# Patient Record
Sex: Female | Born: 1990 | Race: Black or African American | Hispanic: No | Marital: Single | Smoking: Current some day smoker
Health system: Southern US, Community
[De-identification: ages and names within clinical notes are randomized; demographics above are authoritative.]

## PROBLEM LIST (undated history)

## (undated) DIAGNOSIS — F419 Anxiety disorder, unspecified: Secondary | ICD-10-CM

## (undated) DIAGNOSIS — M419 Scoliosis, unspecified: Secondary | ICD-10-CM

## (undated) DIAGNOSIS — I10 Essential (primary) hypertension: Secondary | ICD-10-CM

## (undated) HISTORY — DX: Scoliosis, unspecified: M41.9

## (undated) HISTORY — PX: NO PAST SURGERIES: SHX2092

---

## 2015-03-17 ENCOUNTER — Emergency Department (HOSPITAL_COMMUNITY)
Admission: EM | Admit: 2015-03-17 | Discharge: 2015-03-17 | Disposition: A | Discharge status: Home / Self Care | Payer: Medicaid Other | Attending: Emergency Medicine | Admitting: Emergency Medicine

## 2015-03-17 ENCOUNTER — Encounter (HOSPITAL_COMMUNITY): Payer: Self-pay | Admitting: *Deleted

## 2015-03-17 DIAGNOSIS — Y939 Activity, unspecified: Secondary | ICD-10-CM | POA: Insufficient documentation

## 2015-03-17 DIAGNOSIS — S00551A Superficial foreign body of lip, initial encounter: Secondary | ICD-10-CM

## 2015-03-17 DIAGNOSIS — Y929 Unspecified place or not applicable: Secondary | ICD-10-CM | POA: Insufficient documentation

## 2015-03-17 DIAGNOSIS — S01521A Laceration with foreign body of lip, initial encounter: Secondary | ICD-10-CM | POA: Insufficient documentation

## 2015-03-17 DIAGNOSIS — Y998 Other external cause status: Secondary | ICD-10-CM | POA: Diagnosis not present

## 2015-03-17 DIAGNOSIS — X58XXXA Exposure to other specified factors, initial encounter: Secondary | ICD-10-CM | POA: Insufficient documentation

## 2015-03-17 NOTE — ED Notes (Signed)
Pt reports her bottom lip has grown over her lip ring x 3 months. No distress noted at triage.

## 2015-03-17 NOTE — Discharge Instructions (Signed)
PLease follow-up with your primary care doctor or plastic surgeon to have the ring removed.

## 2015-03-17 NOTE — ED Provider Notes (Signed)
CSN: [id number]     Arrival date & time 03/17/15  1247 History  This chart was scribed for Marlon Pel, PA-C, working with Nelva Nay, MD by Chestine Spore, ED Scribe. The patient was seen in room TR06C/TR06C at 1:38 PM.     Chief Complaint  Patient presents with  . Lip Laceration      The history is provided by the patient. No language interpreter was used.    HPI Comments: Priscilla Young is a 24 y.o. female who presents to the Emergency Department complaining of skin growing over lip ring.  Pt has been trying to push the lip ring out for the past couple months but she has had it for 2-3 years and just noticed that the skin has grown over, unsure for how long. Pt notes that her bottom lip has grown over her lip ring. 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 Pt has not been to the jeweler where she had her piercing at. Pt notes that she has a thin flat circle on the end of it which is the part that is stuck. Pt notes that she has to take out the lip ring because of her job. She denies fever, chills, and any other symptoms. Pt does not have a PCP but her children has one.   No past medical history on file. No past surgical history on file. No family history on file. History  Substance Use Topics  . Smoking status: Not on file  . Smokeless tobacco: Not on file  . Alcohol Use: No   OB History    No data available     Review of Systems  Constitutional: Negative for fever and chills.  Skin: Negative for color change and wound.       Lip ring stuck in lip      Allergies  Review of patient's allergies indicates no known allergies.  Home Medications   Prior to Admission medications   Not on File   BP 131/67 mmHg  Pulse 86  Temp(Src) 98.4 F (36.9 C) (Oral)  Resp 18  SpO2 100%  LMP 02/15/2015 Physical Exam  Constitutional: She is oriented to person, place, and time. She appears well-developed  and well-nourished. No distress.  HENT:  Head: Normocephalic and atraumatic.    Eyes: EOM are normal.  Neck: Neck supple. No tracheal deviation present.  Cardiovascular: Normal rate.   Pulmonary/Chest: Effort normal. No respiratory distress.  Musculoskeletal: Normal range of motion.  Neurological: She is alert and oriented to person, place, and time.  Skin: Skin is warm and dry.  Psychiatric: She has a normal mood and affect. Her behavior is normal.  Nursing note and vitals reviewed.   ED Course  Procedures (including critical care time) DIAGNOSTIC STUDIES: Oxygen Saturation is 100% on RA, nl by my interpretation.    COORDINATION OF CARE: 1:42 PM-Discussed treatment plan which includes referral to plastic surgeon with pt at bedside and pt agreed to plan.   Labs Review Labs Reviewed - No data to display  Imaging Review No results found.   EKG Interpretation None      MDM   Final diagnoses:  Foreign body in lip, initial encounter   We are unable to remove the patient's lip ring at the skin has gone completely over and would require incision for removal. Recommend she go to a piercing location or to the plastic surgeon that she has  been referred to here today at this visit. The ring shows no signs of infection, no tongue or lip swelling.  Medications - No data to display  24 y.o.Adryan Nibert's evaluation in the Emergency Department is complete. It has been determined that no acute conditions requiring further emergency intervention are present at this time. The patient/guardian have been advised of the diagnosis and plan. We have discussed signs and symptoms that warrant return to the ED, such as changes or worsening in symptoms.  Vital signs are stable at discharge. Filed Vitals:   03/17/15 1310  BP: 131/67  Pulse: 86  Temp: 98.4 F (36.9 C)  Resp: 18    Patient/guardian has voiced understanding and agreed to follow-up with the PCP or specialist.  I  personally performed the services described in this documentation, which was scribed in my presence. The recorded information has been reviewed and is accurate.    Marlon Peliffany Ahmere Hemenway, PA-C 03/17/15 1356  Nelva Nayobert Beaton, MD 03/18/15 [phone]

## 2015-03-17 NOTE — ED Notes (Signed)
Patient is alert and orientedx4.  Patient was explained discharge instructions and they understood them with no questions.   

## 2016-01-15 ENCOUNTER — Ambulatory Visit (HOSPITAL_COMMUNITY)
Admission: EM | Admit: 2016-01-15 | Discharge: 2016-01-15 | Disposition: A | Discharge status: Home / Self Care | Payer: Medicaid Other | Attending: Family Medicine | Admitting: Family Medicine

## 2016-01-15 ENCOUNTER — Encounter (HOSPITAL_COMMUNITY): Payer: Self-pay | Admitting: *Deleted

## 2016-01-15 DIAGNOSIS — A084 Viral intestinal infection, unspecified: Secondary | ICD-10-CM

## 2016-01-15 HISTORY — DX: Essential (primary) hypertension: I10

## 2016-01-15 MED ORDER — ONDANSETRON HCL 4 MG PO TABS: 4.0000 mg | ORAL_TABLET | Freq: Three times a day (TID) | ORAL | Status: DC | PRN

## 2016-01-15 NOTE — ED Provider Notes (Signed)
CSN: [id number]     Arrival date & time 01/15/16  1804 History   First MD Initiated Contact with Patient 01/15/16 1936     Chief Complaint  Patient presents with  . Emesis   (Consider location/radiation/quality/duration/timing/severity/associated sxs/prior Treatment) Patient is a 25 y.o. female presenting with vomiting. The history is provided by the patient. No language interpreter was used.  Emesis Associated symptoms: diarrhea   Associated symptoms: no abdominal pain and no chills    Patient presents for nausea/vomiting and diarrhea which began yesterday morning at 4am. Woke up with recurrent vomiting and diarrhea, which persisted all day. Was able to take water at 9pm yesterday.  Today she has done much better, with her last episode diarrhea at 0620am this morning and her last emesis at 1530pm today.  Some cramping in her abdomen from retching.    No fevers or chills. No sick contacts.  Her children (ages 59 and 3) had similar GI sxs 2 weeks ago, resolved. No sick contacts at her new job at [employer].  Ate [employer] the night before she got sick, no one else got sick.   Social Hx recently moved to [place] from [place].  Nexplanon for [facility].  No menses since Nexplanon placed.  Past Medical History  Diagnosis Date  . Hypertension    History reviewed. No pertinent past surgical history. History reviewed. No pertinent family history. Social History  Substance Use Topics  . Smoking status: None  . Smokeless tobacco: None  . Alcohol Use: No   OB History    No data available     Review of Systems  Constitutional: Negative for fever, chills and diaphoresis.  Respiratory: Negative for cough and shortness of breath.   Gastrointestinal: Positive for nausea, vomiting and diarrhea. Negative for abdominal pain.  Genitourinary: Positive for decreased urine volume. Negative for dysuria, urgency and hematuria.    Allergies  Review of patient's allergies indicates no known  allergies.  Home Medications   Prior to Admission medications   Medication Sig Start Date End Date Taking? Authorizing Provider  ondansetron (ZOFRAN) 4 MG tablet Take 1 tablet (4 mg total) by mouth every 8 (eight) hours as needed for nausea or vomiting. 01/15/16   Barbaraann Barthel, MD   Meds Ordered and Administered this Visit  Medications - No data to display  BP 126/72 mmHg  Pulse 78  Temp(Src) 98.6 F (37 C) (Oral)  Resp 18  SpO2 100% No data found.   Physical Exam  Constitutional: She appears well-developed and well-nourished. No distress.  HENT:  Head: Normocephalic and atraumatic.  Mouth/Throat: Oropharynx is clear and moist. No oropharyngeal exudate.  Moist mucus membranes.  Tongue piercing.   Neck: Neck supple.  Cardiovascular: Normal rate, regular rhythm and normal heart sounds.   Pulmonary/Chest: Effort normal and breath sounds normal. No respiratory distress. She has no wheezes. She has no rales. She exhibits no tenderness.  Abdominal: Soft. Bowel sounds are normal. She exhibits no distension and no mass. There is no tenderness. There is no rebound and no guarding.  No CVA tenderness.  No tenderness or guarding in abdomen.  No suprapubic tenderness.   Lymphadenopathy:    She has no cervical adenopathy.  Skin: She is not diaphoretic.    ED Course  Procedures (including critical care time)  Labs Review Labs Reviewed - No data to display  Imaging Review No results found.   Visual Acuity Review  Right Eye Distance:   Left Eye Distance:  Bilateral Distance:    Right Eye Near:   Left Eye Near:    Bilateral Near:         MDM   1. Viral gastroenteritis    Suspect viral gastroenteritis. Discussed hygiene measures to prevent spread. Oral rehydration; diet as tolerated. Small Rx Zofran in case return of nausea, [facility].  Appears well now, no indications for labs or need for IVF by exam and vitals.     Barbaraann BarthelJames O  Magdaleno Lortie, MD 01/15/16 [id number]

## 2016-01-15 NOTE — ED Notes (Signed)
Pt  Reports  Symptoms  Of  Nausea   Vomiting  /  Diarrhea   With  Onset   Yesterday  At  About  4  Pm         pt  Thinks  It may be  A  Stomach  Virus    She is  Not  Actively  Vomiting  At  This  Time    She  Is  Sitting  Upright on the  Exam table  In no  Severe  Distress

## 2016-01-15 NOTE — Discharge Instructions (Signed)
It is a pleasure to see you today.  I believe your symptoms are caused by a viral gastroenteritis.   As we discussed, small sips of liquids (sports drinks, ginger ale) frequently throughout the day.  Avoid greasy or fried foods; try bland things (mashed potatoes, toast, noodles, rice) and advance the diet as you tolerate.   I am giving a prescription for an anti-nausea medicine ZOFRAN.  Only take this medicine if you have a return of the nausea and vomiting. If it persists, please return to the urgent care center or the emergency department for re-evaluation.

## 2016-08-17 ENCOUNTER — Encounter (HOSPITAL_COMMUNITY): Payer: Self-pay

## 2016-08-17 ENCOUNTER — Emergency Department (HOSPITAL_COMMUNITY)
Admission: EM | Admit: 2016-08-17 | Discharge: 2016-08-17 | Disposition: A | Discharge status: Home / Self Care | Payer: Medicaid Other | Attending: Emergency Medicine | Admitting: Emergency Medicine

## 2016-08-17 ENCOUNTER — Emergency Department (HOSPITAL_COMMUNITY): Payer: Medicaid Other

## 2016-08-17 DIAGNOSIS — R05 Cough: Secondary | ICD-10-CM | POA: Diagnosis present

## 2016-08-17 DIAGNOSIS — J069 Acute upper respiratory infection, unspecified: Secondary | ICD-10-CM | POA: Diagnosis not present

## 2016-08-17 DIAGNOSIS — B9789 Other viral agents as the cause of diseases classified elsewhere: Secondary | ICD-10-CM

## 2016-08-17 DIAGNOSIS — I10 Essential (primary) hypertension: Secondary | ICD-10-CM | POA: Diagnosis not present

## 2016-08-17 MED ORDER — PROMETHAZINE-PHENYLEPH-CODEINE 6.25-5-10 MG/5ML PO SYRP: 5.0000 mL | ORAL_SOLUTION | Freq: Four times a day (QID) | ORAL | 0 refills | Status: DC | PRN

## 2016-08-17 MED ORDER — NAPROXEN 250 MG PO TABS
375.0000 mg | ORAL_TABLET | Freq: Once | ORAL | Status: AC
  Administered 2016-08-17: 375 mg via ORAL
  Filled 2016-08-17: qty 2

## 2016-08-17 NOTE — ED Provider Notes (Signed)
[facility]-EMERGENCY DEPT Provider Note   CSN: [id number] Arrival date & time: 08/17/16  1736  By signing my name below, I, Teofilo PodMatthew P. Jamison, attest that this documentation has been prepared under the direction and in the presence of Felicie Mornavid Khylee Algeo, NP. Electronically Signed: Teofilo PodMatthew P. Jamison, ED Scribe. 08/17/2016. 6:19 PM.     History   Chief Complaint Chief Complaint  Patient presents with  . cough, congestion, headache   The history is provided by the patient. No language interpreter was used.   HPI Comments:  Priscilla Young is a 25 y.o. female who presents to the Emergency Department complaining of multiple URI symptoms x 1 week. Pt complains of associated sore throat, rhinorrhea, cough, congestion, headache, low-grade fever, intermittent chills, lightheadedness. Pt reports exposure to mildew in her apartment. No alleviating factors noted. Pt denies nausea, vomiting.    Past Medical History:  Diagnosis Date  . Hypertension     There are no active problems to display for this patient.   History reviewed. No pertinent surgical history.  OB History    No data available       Home Medications    Prior to Admission medications   Medication Sig Start Date End Date Taking? Authorizing Provider  ondansetron (ZOFRAN) 4 MG tablet Take 1 tablet (4 mg total) by mouth every 8 (eight) hours as needed for nausea or vomiting. 01/15/16   Barbaraann BarthelJames O Breen, MD    Family History No family history on file.  Social History Social History  Substance Use Topics  . Smoking status: Not on file  . Smokeless tobacco: Not on file  . Alcohol use No     Allergies   Patient has no known allergies.   Review of Systems Review of Systems  Constitutional: Positive for chills and fever.  HENT: Positive for congestion, rhinorrhea and sore throat.   Respiratory: Positive for cough.   Gastrointestinal: Negative for nausea and vomiting.  Neurological: Positive for light-headedness and  headaches.  All other systems reviewed and are negative.    Physical Exam Updated Vital Signs BP 118/79 (BP Location: Right Arm)   Pulse 76   Temp 98.1 F (36.7 C) (Oral)   Resp 18   SpO2 100%   Physical Exam  Constitutional: She appears well-developed and well-nourished. No distress.  HENT:  Head: Normocephalic and atraumatic.  Mouth/Throat: Posterior oropharyngeal erythema present.  Mild swelling noted to nasal turbinates.   Eyes: Conjunctivae are normal.  Cardiovascular: Normal rate.   Pulmonary/Chest: Effort normal.  Expiratory rhonchi on right chest.   Abdominal: She exhibits no distension.  Neurological: She is alert.  Skin: Skin is warm and dry.  Psychiatric: She has a normal mood and affect.  Nursing note and vitals reviewed.    ED Treatments / Results  DIAGNOSTIC STUDIES:  Oxygen Saturation is 100% on RA, normal by my interpretation.    COORDINATION OF CARE:  6:19 PM Discussed treatment plan with pt at bedside and pt agreed to plan.   Labs (all labs ordered are listed, but only abnormal results are displayed) Labs Reviewed - No data to display  EKG  EKG Interpretation None       Radiology Dg Chest 2 View  Result Date: 08/17/2016 CLINICAL DATA:  Mid and upper chest pain with productive cough for 1 week. EXAM: CHEST  2 VIEW COMPARISON:  None. FINDINGS: Lungs are clear. Heart size is normal. No pneumothorax or pleural effusion. No bony abnormality. IMPRESSION: Negative chest. Electronically Signed   By:  Drusilla Kannerhomas  Dalessio M.D.   On: 08/17/2016 19:11    Procedures Procedures (including critical care time)  Medications Ordered in ED Medications - No data to display   Initial Impression / Assessment and Plan / ED Course  I have reviewed the triage vital signs and the nursing notes.  Pertinent labs & imaging results that were available during my care of the patient were reviewed by me and considered in my medical decision making (see chart for  details).  Clinical Course   Pt symptoms consistent with URI. CXR negative for acute infiltrate. Pt will be discharged with symptomatic treatment.  Discussed return precautions.  Pt is hemodynamically stable & in NAD prior to discharge.   Final Clinical Impressions(s) / ED Diagnoses   Final diagnoses:  Viral URI with cough    New Prescriptions New Prescriptions   PROMETHAZINE-PHENYLEPH-CODEINE 6.25-5-10 MG/5ML SYRP    Take 5 mLs by mouth every 6 (six) hours as needed.   I personally performed the services described in this documentation, which was scribed in my presence. The recorded information has been reviewed and is accurate.    Felicie Mornavid Ryosuke Ericksen, NP 08/17/16 2034    Lyndal Pulleyaniel Knott, MD 08/18/16 [phone]

## 2016-08-17 NOTE — ED Triage Notes (Signed)
Patient here with frontal headache, cough and congestion x 1 week. Reports low grade fever at work. Thinks related to mold in her apartment, NAD

## 2016-08-17 NOTE — ED Notes (Signed)
Patient returned from xray.

## 2019-09-30 ENCOUNTER — Other Ambulatory Visit: Payer: Self-pay

## 2019-09-30 ENCOUNTER — Emergency Department (HOSPITAL_COMMUNITY)
Admission: EM | Admit: 2019-09-30 | Discharge: 2019-10-01 | Disposition: A | Discharge status: Home / Self Care | Payer: Medicaid Other | Attending: Emergency Medicine | Admitting: Emergency Medicine

## 2019-09-30 DIAGNOSIS — T07XXXA Unspecified multiple injuries, initial encounter: Secondary | ICD-10-CM | POA: Diagnosis present

## 2019-09-30 DIAGNOSIS — R0789 Other chest pain: Secondary | ICD-10-CM | POA: Diagnosis not present

## 2019-09-30 DIAGNOSIS — S5012XA Contusion of left forearm, initial encounter: Secondary | ICD-10-CM | POA: Diagnosis not present

## 2019-09-30 DIAGNOSIS — Y939 Activity, unspecified: Secondary | ICD-10-CM | POA: Diagnosis not present

## 2019-09-30 DIAGNOSIS — Y929 Unspecified place or not applicable: Secondary | ICD-10-CM | POA: Diagnosis not present

## 2019-09-30 DIAGNOSIS — I10 Essential (primary) hypertension: Secondary | ICD-10-CM | POA: Insufficient documentation

## 2019-09-30 DIAGNOSIS — S7002XA Contusion of left hip, initial encounter: Secondary | ICD-10-CM | POA: Insufficient documentation

## 2019-09-30 DIAGNOSIS — Y999 Unspecified external cause status: Secondary | ICD-10-CM | POA: Insufficient documentation
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
## 2019-10-01 ENCOUNTER — Emergency Department (HOSPITAL_COMMUNITY): Payer: Medicaid Other

## 2019-10-01 ENCOUNTER — Other Ambulatory Visit: Payer: Self-pay

## 2019-10-01 LAB — POC URINE PREG, ED: Preg Test, Ur: NEGATIVE

## 2019-10-01 MED ORDER — IBUPROFEN 400 MG PO TABS
400.0000 mg | ORAL_TABLET | Freq: Once | ORAL | Status: AC
  Administered 2019-10-01: 400 mg via ORAL
  Filled 2019-10-01: qty 1
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
## 2019-10-01 NOTE — Discharge Instructions (Addendum)
Apply ice for 30 minutes at a time, 3-4 times a day.  Take acetaminophen or ibuprofen or naproxen as needed for pain.

## 2019-10-01 NOTE — ED Notes (Signed)
Pt to xr 

## 2019-10-01 NOTE — ED Notes (Signed)
Pt verbalized under standing of d/c instruction and s/s requiring return to ed. Pt had no further questions at this time.

## 2019-10-02 ENCOUNTER — Ambulatory Visit (HOSPITAL_COMMUNITY)
Admission: EM | Admit: 2019-10-02 | Discharge: 2019-10-02 | Disposition: A | Discharge status: Home / Self Care | Payer: Medicaid Other | Attending: Family Medicine | Admitting: Family Medicine

## 2019-10-02 ENCOUNTER — Other Ambulatory Visit: Payer: Self-pay

## 2019-10-02 ENCOUNTER — Encounter (HOSPITAL_COMMUNITY): Payer: Self-pay | Admitting: Emergency Medicine

## 2019-10-02 ENCOUNTER — Emergency Department (HOSPITAL_COMMUNITY)
Admission: EM | Admit: 2019-10-02 | Discharge: 2019-10-03 | Disposition: A | Discharge status: Home / Self Care | Payer: Medicaid Other | Attending: Emergency Medicine | Admitting: Emergency Medicine

## 2019-10-02 ENCOUNTER — Encounter (HOSPITAL_COMMUNITY): Payer: Self-pay

## 2019-10-02 DIAGNOSIS — Y92410 Unspecified street and highway as the place of occurrence of the external cause: Secondary | ICD-10-CM | POA: Insufficient documentation

## 2019-10-02 DIAGNOSIS — R0789 Other chest pain: Secondary | ICD-10-CM

## 2019-10-02 DIAGNOSIS — Z5321 Procedure and treatment not carried out due to patient leaving prior to being seen by health care provider: Secondary | ICD-10-CM | POA: Diagnosis not present

## 2019-10-02 DIAGNOSIS — M545 Low back pain: Secondary | ICD-10-CM | POA: Insufficient documentation

## 2019-10-02 DIAGNOSIS — Y9389 Activity, other specified: Secondary | ICD-10-CM | POA: Insufficient documentation

## 2019-10-02 DIAGNOSIS — R110 Nausea: Secondary | ICD-10-CM | POA: Diagnosis not present

## 2019-10-02 DIAGNOSIS — M791 Myalgia, unspecified site: Secondary | ICD-10-CM | POA: Diagnosis not present

## 2019-10-02 DIAGNOSIS — R519 Headache, unspecified: Secondary | ICD-10-CM

## 2019-10-02 DIAGNOSIS — M546 Pain in thoracic spine: Secondary | ICD-10-CM | POA: Diagnosis not present

## 2019-10-02 DIAGNOSIS — Y999 Unspecified external cause status: Secondary | ICD-10-CM | POA: Diagnosis not present

## 2019-10-02 MED ORDER — NAPROXEN 500 MG PO TABS: 500.0000 mg | ORAL_TABLET | Freq: Two times a day (BID) | ORAL | 0 refills | Status: DC

## 2019-10-02 MED ORDER — ONDANSETRON 4 MG PO TBDP: 4.0000 mg | ORAL_TABLET | Freq: Three times a day (TID) | ORAL | 0 refills | Status: DC | PRN

## 2019-10-02 MED ORDER — CYCLOBENZAPRINE HCL 5 MG PO TABS: 5.0000 mg | ORAL_TABLET | Freq: Two times a day (BID) | ORAL | 0 refills | Status: DC | PRN
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
## 2019-10-02 NOTE — Discharge Instructions (Signed)
Please go to emergency room for further evaluation of worsening headache
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
## 2019-10-03 NOTE — ED Notes (Signed)
No answer x3 for vitals recheck and not visible in lobby

## 2020-03-31 ENCOUNTER — Encounter (HOSPITAL_COMMUNITY): Payer: Self-pay | Admitting: Emergency Medicine

## 2020-03-31 ENCOUNTER — Other Ambulatory Visit: Payer: Self-pay

## 2020-03-31 ENCOUNTER — Emergency Department (HOSPITAL_COMMUNITY)
Admission: EM | Admit: 2020-03-31 | Discharge: 2020-03-31 | Disposition: A | Discharge status: Home / Self Care | Payer: Medicaid Other | Attending: Emergency Medicine | Admitting: Emergency Medicine

## 2020-03-31 DIAGNOSIS — I10 Essential (primary) hypertension: Secondary | ICD-10-CM | POA: Insufficient documentation

## 2020-03-31 DIAGNOSIS — N390 Urinary tract infection, site not specified: Secondary | ICD-10-CM | POA: Diagnosis not present

## 2020-03-31 DIAGNOSIS — R300 Dysuria: Secondary | ICD-10-CM | POA: Insufficient documentation

## 2020-03-31 DIAGNOSIS — R1030 Lower abdominal pain, unspecified: Secondary | ICD-10-CM | POA: Diagnosis present

## 2020-03-31 LAB — URINALYSIS, ROUTINE W REFLEX MICROSCOPIC
Bacteria, UA: NONE SEEN
Bilirubin Urine: NEGATIVE
Glucose, UA: NEGATIVE mg/dL
Ketones, ur: NEGATIVE mg/dL
Nitrite: NEGATIVE
Protein, ur: 100 mg/dL — AB
Specific Gravity, Urine: 1.012 (ref 1.005–1.030)
WBC, UA: >50 WBC/hpf — ABNORMAL HIGH (ref 0–5)
pH: 5 (ref 5.0–8.0)

## 2020-03-31 LAB — COMPREHENSIVE METABOLIC PANEL
ALT: 18 U/L (ref 0–44)
AST: 15 U/L (ref 15–41)
Albumin: 3.9 g/dL (ref 3.5–5.0)
Alkaline Phosphatase: 46 U/L (ref 38–126)
Anion gap: 8 (ref 5–15)
BUN: 9 mg/dL (ref 6–20)
CO2: 23 mmol/L (ref 22–32)
Calcium: 9.5 mg/dL (ref 8.9–10.3)
Chloride: 107 mmol/L (ref 98–111)
Creatinine, Ser: 0.87 mg/dL (ref 0.44–1.00)
GFR calc Af Amer: >60 mL/min (ref >60)
GFR calc non Af Amer: >60 mL/min (ref >60)
Glucose, Bld: 116 mg/dL — ABNORMAL HIGH (ref 70–99)
Potassium: 3.9 mmol/L (ref 3.5–5.1)
Sodium: 138 mmol/L (ref 135–145)
Total Bilirubin: 0.3 mg/dL (ref 0.3–1.2)
Total Protein: 7.5 g/dL (ref 6.5–8.1)

## 2020-03-31 LAB — CBC
HCT: 39.6 % (ref 36.0–46.0)
Hemoglobin: 12.1 g/dL (ref 12.0–15.0)
MCH: 25.2 pg — ABNORMAL LOW (ref 26.0–34.0)
MCHC: 30.6 g/dL (ref 30.0–36.0)
MCV: 82.5 fL (ref 80.0–100.0)
Platelets: 211 10*3/uL (ref 150–400)
RBC: 4.8 MIL/uL (ref 3.87–5.11)
RDW: 13.6 % (ref 11.5–15.5)
WBC: 12.1 10*3/uL — ABNORMAL HIGH (ref 4.0–10.5)
nRBC: 0 % (ref 0.0–0.2)

## 2020-03-31 LAB — POC URINE PREG, ED: Preg Test, Ur: NEGATIVE

## 2020-03-31 MED ORDER — CEPHALEXIN 500 MG PO CAPS: 500.0000 mg | ORAL_CAPSULE | Freq: Two times a day (BID) | ORAL | 0 refills | Status: DC

## 2020-03-31 MED ORDER — PHENAZOPYRIDINE HCL 200 MG PO TABS: 200.0000 mg | ORAL_TABLET | Freq: Three times a day (TID) | ORAL | 0 refills | Status: DC

## 2020-03-31 MED ORDER — KETOROLAC TROMETHAMINE 60 MG/2ML IM SOLN
30.0000 mg | Freq: Once | INTRAMUSCULAR | Status: AC
  Administered 2020-03-31: 30 mg via INTRAMUSCULAR
  Filled 2020-03-31: qty 2
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
## 2020-03-31 NOTE — ED Triage Notes (Signed)
Pt c/o bladder pain for the past few days and burning sensation with urination.

## 2020-03-31 NOTE — Discharge Instructions (Signed)
Take Keflex twice a day for 5 days for a bladder infection Take Pyridium as directed for bladder pain and difficulty urinating. This medicine will make your urine turn orange and can stain your underwear so you may want to wear a liner Return if you are not improving in the next 2-3 days

## 2020-04-03 LAB — URINE CULTURE: Culture: >=100000 — AB

## 2020-04-04 ENCOUNTER — Telehealth: Payer: Self-pay

## 2020-04-04 NOTE — Telephone Encounter (Signed)
Post ED Visit - Positive Culture Follow-up  Culture report reviewed by antimicrobial stewardship pharmacist: [facility] []  , Pharm.D. []  Enzo Bi, Pharm.D., BCPS AQ-ID []  , Pharm.D., BCPS []  Celedonio Miyamoto, Pharm.D., BCPS []  [place], Garvin Fila.D., BCPS, AAHIVP []  , Pharm.D., BCPS, AAHIVP []  Georgina Pillion, PharmD, BCPS []  , PharmD, BCPS []  [place], PharmD, BCPS []  [address], PharmD []  , PharmD, BCPS []  Estella Husk, PharmD [facility] []  , PharmD []  Phillips Climes, PharmD []  , PharmD []  Agapito Games, Rph []  ) Verlan Friends, PharmD []  , PharmD []  Mervyn Gay, PharmD []  , PharmD []  Vinnie Level, PharmD []  Roselie Skinner, PharmD []  Retta Diones, PharmD []  , PharmD []  Len Childs, PharmD   Positive urine culture Treated with Cephalexin, organism sensitive to the same and no further patient follow-up is required at this time.  04/04/2020, 8:29 AM

## 2020-06-20 ENCOUNTER — Encounter (HOSPITAL_COMMUNITY): Payer: Self-pay | Admitting: Obstetrics & Gynecology

## 2020-06-20 ENCOUNTER — Inpatient Hospital Stay (HOSPITAL_COMMUNITY)
Admission: AD | Admit: 2020-06-20 | Discharge: 2020-06-20 | Disposition: A | Discharge status: Home / Self Care | Payer: Medicaid Other | Attending: Obstetrics & Gynecology | Admitting: Obstetrics & Gynecology

## 2020-06-20 ENCOUNTER — Other Ambulatory Visit: Payer: Self-pay

## 2020-06-20 DIAGNOSIS — D649 Anemia, unspecified: Secondary | ICD-10-CM | POA: Insufficient documentation

## 2020-06-20 DIAGNOSIS — O161 Unspecified maternal hypertension, first trimester: Secondary | ICD-10-CM | POA: Diagnosis not present

## 2020-06-20 DIAGNOSIS — O0931 Supervision of pregnancy with insufficient antenatal care, first trimester: Secondary | ICD-10-CM | POA: Insufficient documentation

## 2020-06-20 DIAGNOSIS — W19XXXA Unspecified fall, initial encounter: Secondary | ICD-10-CM

## 2020-06-20 DIAGNOSIS — O99011 Anemia complicating pregnancy, first trimester: Secondary | ICD-10-CM | POA: Diagnosis not present

## 2020-06-20 DIAGNOSIS — Z3A01 Less than 8 weeks gestation of pregnancy: Secondary | ICD-10-CM | POA: Insufficient documentation

## 2020-06-20 DIAGNOSIS — O2691 Pregnancy related conditions, unspecified, first trimester: Secondary | ICD-10-CM | POA: Insufficient documentation

## 2020-06-20 DIAGNOSIS — Z8679 Personal history of other diseases of the circulatory system: Secondary | ICD-10-CM

## 2020-06-20 DIAGNOSIS — O99891 Other specified diseases and conditions complicating pregnancy: Secondary | ICD-10-CM | POA: Diagnosis not present

## 2020-06-20 DIAGNOSIS — Z8249 Family history of ischemic heart disease and other diseases of the circulatory system: Secondary | ICD-10-CM | POA: Insufficient documentation

## 2020-06-20 DIAGNOSIS — R55 Syncope and collapse: Secondary | ICD-10-CM | POA: Diagnosis not present

## 2020-06-20 DIAGNOSIS — Z3201 Encounter for pregnancy test, result positive: Secondary | ICD-10-CM

## 2020-06-20 HISTORY — DX: Anxiety disorder, unspecified: F41.9

## 2020-06-20 LAB — URINALYSIS, ROUTINE W REFLEX MICROSCOPIC
Bilirubin Urine: NEGATIVE
Glucose, UA: NEGATIVE mg/dL
Hgb urine dipstick: NEGATIVE
Ketones, ur: NEGATIVE mg/dL
Leukocytes,Ua: NEGATIVE
Nitrite: NEGATIVE
Protein, ur: NEGATIVE mg/dL
Specific Gravity, Urine: 1.023 (ref 1.005–1.030)
pH: 6 (ref 5.0–8.0)

## 2020-06-20 LAB — COMPREHENSIVE METABOLIC PANEL
ALT: 28 U/L (ref 0–44)
AST: 19 U/L (ref 15–41)
Albumin: 3.4 g/dL — ABNORMAL LOW (ref 3.5–5.0)
Alkaline Phosphatase: 31 U/L — ABNORMAL LOW (ref 38–126)
Anion gap: 7 (ref 5–15)
BUN: 8 mg/dL (ref 6–20)
CO2: 24 mmol/L (ref 22–32)
Calcium: 8.9 mg/dL (ref 8.9–10.3)
Chloride: 106 mmol/L (ref 98–111)
Creatinine, Ser: 0.69 mg/dL (ref 0.44–1.00)
GFR, Estimated: >60 mL/min (ref >60)
Glucose, Bld: 97 mg/dL (ref 70–99)
Potassium: 4.4 mmol/L (ref 3.5–5.1)
Sodium: 137 mmol/L (ref 135–145)
Total Bilirubin: 0.2 mg/dL — ABNORMAL LOW (ref 0.3–1.2)
Total Protein: 6.5 g/dL (ref 6.5–8.1)

## 2020-06-20 LAB — CBC
HCT: 33.5 % — ABNORMAL LOW (ref 36.0–46.0)
Hemoglobin: 10.7 g/dL — ABNORMAL LOW (ref 12.0–15.0)
MCH: 25.9 pg — ABNORMAL LOW (ref 26.0–34.0)
MCHC: 31.9 g/dL (ref 30.0–36.0)
MCV: 81.1 fL (ref 80.0–100.0)
Platelets: 235 10*3/uL (ref 150–400)
RBC: 4.13 MIL/uL (ref 3.87–5.11)
RDW: 14.8 % (ref 11.5–15.5)
WBC: 9.1 10*3/uL (ref 4.0–10.5)
nRBC: 0 % (ref 0.0–0.2)

## 2020-06-20 LAB — POCT PREGNANCY, URINE: Preg Test, Ur: POSITIVE — AB

## 2020-06-20 MED ORDER — DOXYLAMINE SUCCINATE (SLEEP) 25 MG PO TABS: 25.0000 mg | ORAL_TABLET | Freq: Three times a day (TID) | ORAL | 0 refills | Status: DC | PRN

## 2020-06-20 MED ORDER — PYRIDOXINE HCL 25 MG PO TABS: 25.0000 mg | ORAL_TABLET | Freq: Three times a day (TID) | ORAL | 0 refills | Status: DC

## 2020-06-20 NOTE — Discharge Instructions (Signed)
Safe Medications in Pregnancy   Acne: Benzoyl Peroxide Salicylic Acid  Backache/Headache: Tylenol: 2 regular strength every 4 hours OR              2 Extra strength every 6 hours  Colds/Coughs/Allergies: Benadryl (alcohol free) 25 mg every 6 hours as needed Breath right strips Claritin Cepacol throat lozenges Chloraseptic throat spray Cold-Eeze- up to three times per day Cough drops, alcohol free Flonase (by prescription only) Guaifenesin Mucinex Robitussin DM (plain only, alcohol free) Saline nasal spray/drops Sudafed (pseudoephedrine) & Actifed ** use only after [redacted] weeks gestation and if you do not have high blood pressure Tylenol Vicks Vaporub Zinc lozenges Zyrtec   Constipation: Colace Ducolax suppositories Fleet enema Glycerin suppositories Metamucil Milk of magnesia Miralax Senokot Smooth move tea  Diarrhea: Kaopectate Imodium A-D  *NO pepto Bismol  Hemorrhoids: Anusol Anusol HC Preparation H Tucks  Indigestion: Tums Maalox Mylanta Zantac  Pepcid  Insomnia: Benadryl (alcohol free) 25mg  every 6 hours as needed Tylenol PM Unisom, no Gelcaps  Leg Cramps: Tums MagGel  Nausea/Vomiting:  Bonine Dramamine Emetrol Ginger extract Sea bands Meclizine  Nausea medication to take during pregnancy:  Unisom (doxylamine succinate 25 mg tablets) Take one tablet daily at bedtime. If symptoms are not adequately controlled, the dose can be increased to a maximum recommended dose of two tablets daily (1/2 tablet in the morning, 1/2 tablet mid-afternoon and one at bedtime). Vitamin B6 100mg  tablets. Take one tablet twice a day (up to 200 mg per day).  Skin Rashes: Aveeno products Benadryl cream or 25mg  every 6 hours as needed Calamine Lotion 1% cortisone cream  Yeast infection: Gyne-lotrimin 7 Monistat 7   **If taking multiple medications, please check labels to avoid duplicating the same active ingredients **take medication as directed on  the label ** Do not exceed 4000 mg of tylenol in 24 hours **Do not take medications that contain aspirin or ibuprofen     Eating Plan for Pregnant Women While you are pregnant, your body requires additional nutrition to help support your growing baby. You also have a higher need for some vitamins and minerals, such as folic acid, calcium, iron, and vitamin D. Eating a healthy, well-balanced diet is very important for your health and your baby's health. Your need for extra calories varies for the three 24-month segments of your pregnancy (trimesters). For most women, it is recommended to consume:  150 extra calories a day during the first trimester.  300 extra calories a day during the second trimester.  300 extra calories a day during the third trimester. What are tips for following this plan?   Do not try to lose weight or go on a diet during pregnancy.  Limit your overall intake of foods that have "empty calories." These are foods that have little nutritional value, such as sweets, desserts, candies, and sugar-sweetened beverages.  Eat a variety of foods (especially fruits and vegetables) to get a full range of vitamins and minerals.  Take a prenatal vitamin to help meet your additional vitamin and mineral needs during pregnancy, specifically for folic acid, iron, calcium, and vitamin D.  Remember to stay active. Ask your health care provider what types of exercise and activities are safe for you.  Practice good food safety and cleanliness. Wash your hands before you eat and after you prepare raw meat. Wash all fruits and vegetables well before peeling or eating. Taking these actions can help to prevent food-borne illnesses that can be very dangerous to your baby, such  as listeriosis. Ask your health care provider for more information about listeriosis. What does 150 extra calories look like? Healthy options that provide 150 extra calories each day could be any of the following:  6-8  oz (170-230 g) of plain low-fat yogurt with  cup of berries.  1 apple with 2 teaspoons (11 g) of peanut butter.  Cut-up vegetables with  cup (60 g) of hummus.  8 oz (230 mL) or 1 cup of low-fat chocolate milk.  1 stick of string cheese with 1 medium orange.  1 peanut butter and jelly sandwich that is made with one slice of whole-wheat bread and 1 tsp (5 g) of peanut butter. For 300 extra calories, you could eat two of those healthy options each day. What is a healthy amount of weight to gain? The right amount of weight gain for you is based on your BMI before you became pregnant. If your BMI:  Was less than 18 (underweight), you should gain 28-40 lb (13-18 kg).  Was 18-24.9 (normal), you should gain 25-35 lb (11-16 kg).  Was 25-29.9 (overweight), you should gain 15-25 lb (7-11 kg).  Was 30 or greater (obese), you should gain 11-20 lb (5-9 kg). What if I am having twins or multiples? Generally, if you are carrying twins or multiples:  You may need to eat 300-600 extra calories a day.  The recommended range for total weight gain is 25-54 lb (11-25 kg), depending on your BMI before pregnancy.  Talk with your health care provider to find out about nutritional needs, weight gain, and exercise that is right for you. What foods can I eat?  Fruits All fruits. Eat a variety of colors and types of fruit. Remember to wash your fruits well before peeling or eating. Vegetables All vegetables. Eat a variety of colors and types of vegetables. Remember to wash your vegetables well before peeling or eating. Grains All grains. Choose whole grains, such as whole-wheat bread, oatmeal, or [place] rice. Meats and other protein foods Lean meats, including chicken, [place], fish, and lean cuts of beef, veal, or pork. If you eat fish or seafood, choose options that are higher in omega-3 fatty acids and lower in mercury, such as salmon, herring, mussels, trout, sardines, pollock, shrimp, crab, and  lobster. Tofu. Tempeh. Beans. Eggs. Peanut butter and other nut butters. Make sure that all meats, poultry, and eggs are cooked to food-safe temperatures or "well-done." Two or more servings of fish are recommended each week in order to get the most benefits from omega-3 fatty acids that are found in seafood. Choose fish that are lower in mercury. You can find more information online:  PumpkinSearch.com.ee Dairy Pasteurized milk and milk alternatives (such as almond milk). Pasteurized yogurt and pasteurized cheese. Cottage cheese. Sour cream. Beverages Water. Juices that contain 100% fruit juice or vegetable juice. Caffeine-free teas and decaffeinated coffee. Drinks that contain caffeine are okay to drink, but it is better to avoid caffeine. Keep your total caffeine intake to less than 200 mg each day (which is 12 oz or 355 mL of coffee, tea, or soda) or the limit as told by your health care provider. Fats and oils Fats and oils are okay to include in moderation. Sweets and desserts Sweets and desserts are okay to include in moderation. Seasoning and other foods All pasteurized condiments. The items listed above may not be a complete list of foods and beverages you can eat. Contact a dietitian for more information. What foods are not recommended? Fruits  Unpasteurized fruit juices. Vegetables Raw (unpasteurized) vegetable juices. Meats and other protein foods Lunch meats, bologna, hot dogs, or other deli meats. (If you must eat those meats, reheat them until they are steaming hot.) Refrigerated pat, meat spreads from a meat counter, smoked seafood that is found in the refrigerated section of a store. Raw or undercooked meats, poultry, and eggs. Raw fish, such as sushi or sashimi. Fish that have high mercury content, such as tilefish, shark, swordfish, and king mackerel. To learn more about mercury in fish, talk with your health care provider or look for online resources, such  as:  PumpkinSearch.com.ee Dairy Raw (unpasteurized) milk and any foods that have raw milk in them. Soft cheeses, such as feta, queso blanco, queso fresco, Brie, Camembert cheeses, blue-veined cheeses, and Panela cheese (unless it is made with pasteurized milk, which must be stated on the label). Beverages Alcohol. Sugar-sweetened beverages, such as sodas, teas, or energy drinks. Seasoning and other foods Homemade fermented foods and drinks, such as pickles, sauerkraut, or kombucha drinks. (Store-bought pasteurized versions of these are okay.) Salads that are made in a store or deli, such as ham salad, chicken salad, egg salad, tuna salad, and seafood salad. The items listed above may not be a complete list of foods and beverages you should avoid. Contact a dietitian for more information. Where to find more information To calculate the number of calories you need based on your height, weight, and activity level, you can use an online calculator such as:  PackageNews.is To calculate how much weight you should gain during pregnancy, you can use an online pregnancy weight gain calculator such as:  http://jones-berg.com/ Summary  While you are pregnant, your body requires additional nutrition to help support your growing baby.  Eat a variety of foods, especially fruits and vegetables to get a full range of vitamins and minerals.  Practice good food safety and cleanliness. Wash your hands before you eat and after you prepare raw meat. Wash all fruits and vegetables well before peeling or eating. Taking these actions can help to prevent food-borne illnesses, such as listeriosis, that can be very dangerous to your baby.  Do not eat raw meat or fish. Do not eat fish that have high mercury content, such as tilefish, shark, swordfish, and king mackerel. Do not eat unpasteurized (raw) dairy.  Take a prenatal vitamin to help meet your additional vitamin and  mineral needs during pregnancy, specifically for folic acid, iron, calcium, and vitamin D. This information is not intended to replace advice given to you by your health care provider. Make sure you discuss any questions you have with your health care provider. Document Revised: 01/05/2019 Document Reviewed: 05/14/2017 Elsevier Patient Education  2020 [employer].
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
## 2020-07-11 ENCOUNTER — Inpatient Hospital Stay (HOSPITAL_COMMUNITY): Payer: Medicaid Other

## 2020-07-11 ENCOUNTER — Inpatient Hospital Stay (HOSPITAL_COMMUNITY)
Admission: AD | Admit: 2020-07-11 | Discharge: 2020-07-11 | Disposition: A | Discharge status: Home / Self Care | Payer: Medicaid Other | Attending: Obstetrics and Gynecology | Admitting: Obstetrics and Gynecology

## 2020-07-11 ENCOUNTER — Other Ambulatory Visit: Payer: Self-pay

## 2020-07-11 ENCOUNTER — Encounter (HOSPITAL_COMMUNITY): Payer: Self-pay | Admitting: Obstetrics and Gynecology

## 2020-07-11 DIAGNOSIS — O99331 Smoking (tobacco) complicating pregnancy, first trimester: Secondary | ICD-10-CM | POA: Diagnosis not present

## 2020-07-11 DIAGNOSIS — Z3A01 Less than 8 weeks gestation of pregnancy: Secondary | ICD-10-CM | POA: Diagnosis not present

## 2020-07-11 DIAGNOSIS — O26851 Spotting complicating pregnancy, first trimester: Secondary | ICD-10-CM | POA: Insufficient documentation

## 2020-07-11 DIAGNOSIS — R109 Unspecified abdominal pain: Secondary | ICD-10-CM | POA: Diagnosis not present

## 2020-07-11 DIAGNOSIS — R1033 Periumbilical pain: Secondary | ICD-10-CM | POA: Diagnosis not present

## 2020-07-11 DIAGNOSIS — R1030 Lower abdominal pain, unspecified: Secondary | ICD-10-CM | POA: Insufficient documentation

## 2020-07-11 DIAGNOSIS — O26891 Other specified pregnancy related conditions, first trimester: Secondary | ICD-10-CM

## 2020-07-11 DIAGNOSIS — Z3A10 10 weeks gestation of pregnancy: Secondary | ICD-10-CM | POA: Diagnosis not present

## 2020-07-11 DIAGNOSIS — O26899 Other specified pregnancy related conditions, unspecified trimester: Secondary | ICD-10-CM

## 2020-07-11 DIAGNOSIS — Z6720 Type B blood, Rh positive: Secondary | ICD-10-CM | POA: Diagnosis not present

## 2020-07-11 DIAGNOSIS — O4691 Antepartum hemorrhage, unspecified, first trimester: Secondary | ICD-10-CM | POA: Diagnosis not present

## 2020-07-11 DIAGNOSIS — F17200 Nicotine dependence, unspecified, uncomplicated: Secondary | ICD-10-CM | POA: Diagnosis not present

## 2020-07-11 DIAGNOSIS — Z3490 Encounter for supervision of normal pregnancy, unspecified, unspecified trimester: Secondary | ICD-10-CM

## 2020-07-11 LAB — URINALYSIS, ROUTINE W REFLEX MICROSCOPIC
Bilirubin Urine: NEGATIVE
Glucose, UA: NEGATIVE mg/dL
Hgb urine dipstick: NEGATIVE
Ketones, ur: NEGATIVE mg/dL
Leukocytes,Ua: NEGATIVE
Nitrite: NEGATIVE
Protein, ur: NEGATIVE mg/dL
Specific Gravity, Urine: 1.025 (ref 1.005–1.030)
pH: 7 (ref 5.0–8.0)

## 2020-07-11 LAB — POC URINE PREG, ED: Preg Test, Ur: POSITIVE — AB

## 2020-07-11 LAB — CBC
HCT: 35 % — ABNORMAL LOW (ref 36.0–46.0)
Hemoglobin: 11.2 g/dL — ABNORMAL LOW (ref 12.0–15.0)
MCH: 25.6 pg — ABNORMAL LOW (ref 26.0–34.0)
MCHC: 32 g/dL (ref 30.0–36.0)
MCV: 79.9 fL — ABNORMAL LOW (ref 80.0–100.0)
Platelets: 228 10*3/uL (ref 150–400)
RBC: 4.38 MIL/uL (ref 3.87–5.11)
RDW: 13.1 % (ref 11.5–15.5)
WBC: 14.6 10*3/uL — ABNORMAL HIGH (ref 4.0–10.5)
nRBC: 0 % (ref 0.0–0.2)

## 2020-07-11 LAB — HCG, QUANTITATIVE, PREGNANCY: hCG, Beta Chain, Quant, S: 200735 m[IU]/mL — ABNORMAL HIGH (ref <5)

## 2020-07-11 LAB — ABO/RH: ABO/RH(D): B POS

## 2020-07-11 LAB — WET PREP, GENITAL
Clue Cells Wet Prep HPF POC: NONE SEEN
Sperm: NONE SEEN
Trich, Wet Prep: NONE SEEN
Yeast Wet Prep HPF POC: NONE SEEN

## 2020-07-11 MED ORDER — PREPLUS 27-1 MG PO TABS: 1.0000 | ORAL_TABLET | Freq: Every day | ORAL | 13 refills | Status: DC

## 2020-07-11 NOTE — ED Provider Notes (Signed)
MSE was initiated and I personally evaluated the patient and placed orders (if any) at  9:58 AM on July 11, 2020.  Patient is a 29 year old G3 T2 P0 A0 L2 who presents to the emergency department due to 3 days of worsening lower abdominal pain as well as spotting started this morning.  States she soaked about 1 tampon.  No chest pain, shortness of breath, dysuria, hematuria.  Unsure of the timing of her last menstrual period.  She had a positive pregnancy test on October 21.  Patient discussed with Joni Reining with the MAU team.  They agreed to have patient transferred to the MAU.  Nursing staff notified.  The patient appears stable so that the remainder of the MSE may be completed by another provider.   Placido Sou, PA-C 07/11/20 1003    Tegeler, Canary Brim, MD 07/11/20 1019

## 2020-07-11 NOTE — Discharge Instructions (Signed)
First Trimester of Pregnancy  The first trimester of pregnancy is from week 1 until the end of week 13 (months 1 through 3). During this time, your baby will begin to develop inside you. At 6-8 weeks, the eyes and face are formed, and the heartbeat can be seen on ultrasound. At the end of 12 weeks, all the baby's organs are formed. Prenatal care is all the medical care you receive before the birth of your baby. Make sure you get good prenatal care and follow all of your doctor's instructions. Follow these instructions at home: Medicines  Take over-the-counter and prescription medicines only as told by your doctor. Some medicines are safe and some medicines are not safe during pregnancy.  Take a prenatal vitamin that contains at least 600 micrograms (mcg) of folic acid.  If you have trouble pooping (constipation), take medicine that will make your stool soft (stool softener) if your doctor approves. Eating and drinking   Eat regular, healthy meals.  Your doctor will tell you the amount of weight gain that is right for you.  Avoid raw meat and uncooked cheese.  If you feel sick to your stomach (nauseous) or throw up (vomit): ? Eat 4 or 5 small meals a day instead of 3 large meals. ? Try eating a few soda crackers. ? Drink liquids between meals instead of during meals.  To prevent constipation: ? Eat foods that are high in fiber, like fresh fruits and vegetables, whole grains, and beans. ? Drink enough fluids to keep your pee (urine) clear or pale yellow. Activity  Exercise only as told by your doctor. Stop exercising if you have cramps or pain in your lower belly (abdomen) or low back.  Do not exercise if it is too hot, too humid, or if you are in a place of great height (high altitude).  Try to avoid standing for long periods of time. Move your legs often if you must stand in one place for a long time.  Avoid heavy lifting.  Wear low-heeled shoes. Sit and stand up  straight.  You can have sex unless your doctor tells you not to. Relieving pain and discomfort  Wear a good support bra if your breasts are sore.  Take warm water baths (sitz baths) to soothe pain or discomfort caused by hemorrhoids. Use hemorrhoid cream if your doctor says it is okay.  Rest with your legs raised if you have leg cramps or low back pain.  If you have puffy, bulging veins (varicose veins) in your legs: ? Wear support hose or compression stockings as told by your doctor. ? Raise (elevate) your feet for 15 minutes, 3-4 times a day. ? Limit salt in your food. Prenatal care  Schedule your prenatal visits by the twelfth week of pregnancy.  Write down your questions. Take them to your prenatal visits.  Keep all your prenatal visits as told by your doctor. This is important. Safety  Wear your seat belt at all times when driving.  Make a list of emergency phone numbers. The list should include numbers for family, friends, the hospital, and police and fire departments. General instructions  Ask your doctor for a referral to a local prenatal class. Begin classes no later than at the start of month 6 of your pregnancy.  Ask for help if you need counseling or if you need help with nutrition. Your doctor can give you advice or tell you where to go for help.  Do not use hot tubs, steam   rooms, or saunas.  Do not douche or use tampons or scented sanitary pads.  Do not cross your legs for long periods of time.  Avoid all herbs and alcohol. Avoid drugs that are not approved by your doctor.  Do not use any tobacco products, including cigarettes, chewing tobacco, and electronic cigarettes. If you need help quitting, ask your doctor. You may get counseling or other support to help you quit.  Avoid cat litter boxes and soil used by cats. These carry germs that can cause birth defects in the baby and can cause a loss of your baby (miscarriage) or stillbirth.  Visit your dentist.  At home, brush your teeth with a soft toothbrush. Be gentle when you floss. Contact a doctor if:  You are dizzy.  You have mild cramps or pressure in your lower belly.  You have a nagging pain in your belly area.  You continue to feel sick to your stomach, you throw up, or you have watery poop (diarrhea).  You have a bad smelling fluid coming from your vagina.  You have pain when you pee (urinate).  You have increased puffiness (swelling) in your face, hands, legs, or ankles. Get help right away if:  You have a fever.  You are leaking fluid from your vagina.  You have spotting or bleeding from your vagina.  You have very bad belly cramping or pain.  You gain or lose weight rapidly.  You throw up blood. It may look like coffee grounds.  You are around people who have German measles, fifth disease, or chickenpox.  You have a very bad headache.  You have shortness of breath.  You have any kind of trauma, such as from a fall or a car accident. Summary  The first trimester of pregnancy is from week 1 until the end of week 13 (months 1 through 3).  To take care of yourself and your unborn baby, you will need to eat healthy meals, take medicines only if your doctor tells you to do so, and do activities that are safe for you and your baby.  Keep all follow-up visits as told by your doctor. This is important as your doctor will have to ensure that your baby is healthy and growing well. This information is not intended to replace advice given to you by your health care provider. Make sure you discuss any questions you have with your health care provider. Document Revised: 12/08/2018 Document Reviewed: 08/25/2016 Elsevier Patient Education  2020 [employer]vier Inc.  

## 2020-07-11 NOTE — ED Notes (Signed)
Transport called to take pt to MAU. 

## 2020-07-11 NOTE — MAU Provider Note (Signed)
History     CSN: [id number]  Arrival date and time: 07/11/20 1607   First Provider Initiated Contact with Patient 07/11/20 1159      Chief Complaint  Patient presents with  . Abdominal Pain  . Spotting   HPI  Priscilla Young is a 29 y.o. G3P2002 in early pregnancy by uncertain LMP who presents to MAU with chief complaints of RLQ and generalized periumbilical pain as well as vaginal spotting. These are new problems, onset about 3 days ago. Most recent intercourse three days ago. She denies abdominal tenderness, dysuria, fever or recent illness.  OB History    Gravida  3   Para  2   Term  2   Preterm      AB      Living  2     SAB      TAB      Ectopic      Multiple      Live Births  2           Past Medical History:  Diagnosis Date  . Anxiety     Past Surgical History:  Procedure Laterality Date  . NO PAST SURGERIES      Family History  Problem Relation Age of Onset  . Hypertension Mother     Social History   Tobacco Use  . Smoking status: Current Some Day Smoker  . Smokeless tobacco: Never Used  Vaping Use  . Vaping Use: Never used  Substance Use Topics  . Alcohol use: Not Currently  . Drug use: No    Allergies: No Known Allergies  No medications prior to admission.    Review of Systems  Gastrointestinal: Positive for abdominal pain.  Genitourinary: Positive for vaginal bleeding.  All other systems reviewed and are negative.  Physical Exam   Blood pressure 122/76, pulse 87, temperature 98.7 F (37.1 C), temperature source Oral, resp. rate 20, height 5\' 4"  (1.626 m), weight 65.8 kg, last menstrual period 05/28/2020, SpO2 100 %.  Physical Exam Vitals and nursing note reviewed. Exam conducted with a chaperone present.  Cardiovascular:     Rate and Rhythm: Normal rate.     Heart sounds: Normal heart sounds.  Pulmonary:     Effort: Pulmonary effort is normal.  Abdominal:     General: Bowel sounds are normal.     Palpations:  Abdomen is soft.     Tenderness: There is no abdominal tenderness. There is no right CVA tenderness or left CVA tenderness.  Skin:    General: Skin is warm and dry.     Capillary Refill: Capillary refill takes less than 2 seconds.  Neurological:     General: No focal deficit present.     Mental Status: She is alert.  Psychiatric:        Mood and Affect: Mood normal.        Behavior: Behavior normal.     MAU Course/MDM  Procedures  Orders Placed This Encounter  Procedures  . Wet prep, genital  . 05/30/2020 OB Comp Less 14 Wks  . CBC  . hCG, quantitative, pregnancy  . Urinalysis, Routine w reflex microscopic Urine, Clean Catch  . Nursing communication  . ABO/Rh   Patient Vitals for the past 24 hrs:  BP Temp Temp src Pulse Resp SpO2 Height Weight  07/11/20 1035 122/76 98.7 F (37.1 C) Oral 87 20 100 % -- --  07/11/20 1029 -- -- -- -- -- -- 5\' 4"  (1.626 m) 65.8 kg  07/11/20 0936 121/85 98.5 F (36.9 C) Oral 87 16 100 % -- --   Results for orders placed or performed during the hospital encounter of 07/11/20 (from the past 24 hour(s))  POC Urine Pregnancy, ED (not at [facility])     Status: Abnormal   Collection Time: 07/11/20  9:53 AM  Result Value Ref Range   Preg Test, Ur POSITIVE (A) NEGATIVE  CBC     Status: Abnormal   Collection Time: 07/11/20 10:37 AM  Result Value Ref Range   WBC 14.6 (H) 4.0 - 10.5 K/uL   RBC 4.38 3.87 - 5.11 MIL/uL   Hemoglobin 11.2 (L) 12.0 - 15.0 g/dL   HCT 82.9 (L) 36 - 46 %   MCV 79.9 (L) 80.0 - 100.0 fL   MCH 25.6 (L) 26.0 - 34.0 pg   MCHC 32.0 30.0 - 36.0 g/dL   RDW 56.2 13.0 - 86.5 %   Platelets 228 150 - 400 K/uL   nRBC 0.0 0.0 - 0.2 %  hCG, quantitative, pregnancy     Status: Abnormal   Collection Time: 07/11/20 10:37 AM  Result Value Ref Range   hCG, Beta Chain, Quant, S 200,735 (H) <5 mIU/mL  ABO/Rh     Status: None   Collection Time: 07/11/20 10:37 AM  Result Value Ref Range   ABO/RH(D) B POS    No rh immune globuloin      NOT A RH  IMMUNE GLOBULIN CANDIDATE, PT RH POSITIVE Performed at [facility], [address], [place], [place] [address]   Wet prep, genital     Status: Abnormal   Collection Time: 07/11/20 11:26 AM   Specimen: Vaginal  Result Value Ref Range   Yeast Wet Prep HPF POC NONE SEEN NONE SEEN   Trich, Wet Prep NONE SEEN NONE SEEN   Clue Cells Wet Prep HPF POC NONE SEEN NONE SEEN   WBC, Wet Prep HPF POC FEW (A) NONE SEEN   Sperm NONE SEEN   Urinalysis, Routine w reflex microscopic Urine, Clean Catch     Status: Abnormal   Collection Time: 07/11/20 11:26 AM  Result Value Ref Range   Color, Urine AMBER (A) YELLOW   APPearance HAZY (A) CLEAR   Specific Gravity, Urine 1.025 1.005 - 1.030   pH 7.0 5.0 - 8.0   Glucose, UA NEGATIVE NEGATIVE mg/dL   Hgb urine dipstick NEGATIVE NEGATIVE   Bilirubin Urine NEGATIVE NEGATIVE   Ketones, ur NEGATIVE NEGATIVE mg/dL   Protein, ur NEGATIVE NEGATIVE mg/dL   Nitrite NEGATIVE NEGATIVE   Leukocytes,Ua NEGATIVE NEGATIVE   US OB Comp Less 14 Wks  Result Date: 07/11/2020 CLINICAL DATA:  Abdominal pain and vaginal spotting.  Unknown LMP. EXAM: OBSTETRIC <14 WK ULTRASOUND TECHNIQUE: Transabdominal ultrasound was performed for evaluation of the gestation as well as the maternal uterus and adnexal regions. COMPARISON:  None. FINDINGS: Intrauterine gestational sac: Single Yolk sac:  Visualized. Embryo:  Visualized. Cardiac Activity: Visualized. Heart Rate: 155 bpm CRL:   28 mm   9 w 4 d                  [place] EDC: 02/09/2021 Subchorionic hemorrhage:  None visualized. Maternal uterus/adnexae: The right ovary is normal in appearance. Left ovary is not directly visualized, however no adnexal mass or abnormal free fluid identified. IMPRESSION: Single living IUP with estimated gestational age of [redacted] weeks 4 days, and [place] EDC of 02/09/2021. No maternal uterine or adnexal abnormality identified. Electronically Signed  By: Danae Orleans M.D.   On: 07/11/2020 11:48   Assessment and Plan   --29 y.o. [id number] with SIUP at [redacted]w[redacted]d  --Blood type B POS --Discharge home in stable condition  F/U: --Patient to establish prenatal care  Calvert Cantor, CNM 07/11/2020, 2:52 PM

## 2020-07-11 NOTE — ED Notes (Signed)
PA Joldersma in to assess patient appropriateness to go to MAU

## 2020-07-11 NOTE — MAU Note (Addendum)
Presents with c/o lower abdominal pain on the right side, pain began 3 days ago.  Also states having spotting with wiping that began today.  Unsure of LMP, despite reporting it was in August.

## 2020-07-12 LAB — GC/CHLAMYDIA PROBE AMP (~~LOC~~) NOT AT ARMC
Chlamydia: NEGATIVE
Comment: NEGATIVE
Comment: NORMAL
Neisseria Gonorrhea: NEGATIVE

## 2020-08-05 DIAGNOSIS — Z3480 Encounter for supervision of other normal pregnancy, unspecified trimester: Secondary | ICD-10-CM | POA: Insufficient documentation

## 2020-08-06 ENCOUNTER — Encounter: Payer: Self-pay | Admitting: Advanced Practice Midwife

## 2020-08-06 ENCOUNTER — Other Ambulatory Visit: Payer: Self-pay

## 2020-08-06 ENCOUNTER — Other Ambulatory Visit (HOSPITAL_COMMUNITY)
Admission: RE | Admit: 2020-08-06 | Discharge: 2020-08-06 | Disposition: A | Discharge status: Home / Self Care | Payer: Medicaid Other | Source: Ambulatory Visit | Attending: Advanced Practice Midwife | Admitting: Advanced Practice Midwife

## 2020-08-06 ENCOUNTER — Ambulatory Visit (INDEPENDENT_AMBULATORY_CARE_PROVIDER_SITE_OTHER): Payer: Medicaid Other | Admitting: Advanced Practice Midwife

## 2020-08-06 VITALS — BP 119/73 | HR 85 | Wt 146.6 lb

## 2020-08-06 DIAGNOSIS — Z3480 Encounter for supervision of other normal pregnancy, unspecified trimester: Secondary | ICD-10-CM

## 2020-08-06 DIAGNOSIS — M4120 Other idiopathic scoliosis, site unspecified: Secondary | ICD-10-CM

## 2020-08-06 DIAGNOSIS — M419 Scoliosis, unspecified: Secondary | ICD-10-CM | POA: Insufficient documentation

## 2020-08-06 DIAGNOSIS — Z23 Encounter for immunization: Secondary | ICD-10-CM

## 2020-08-06 DIAGNOSIS — Z3A13 13 weeks gestation of pregnancy: Secondary | ICD-10-CM

## 2020-08-06 DIAGNOSIS — Z3482 Encounter for supervision of other normal pregnancy, second trimester: Secondary | ICD-10-CM

## 2020-08-06 MED ORDER — GOJJI WEIGHT SCALE MISC: 1.0000 | 0 refills | Status: DC | PRN

## 2020-08-06 MED ORDER — PRENATAL 19 29-1 MG PO CHEW: 1.0000 | CHEWABLE_TABLET | Freq: Every day | ORAL | 11 refills | Status: DC

## 2020-08-06 MED ORDER — FERROUS SULFATE 325 (65 FE) MG PO TABS: 325.0000 mg | ORAL_TABLET | Freq: Every day | ORAL | 11 refills | Status: DC

## 2020-08-06 MED ORDER — BLOOD PRESSURE KIT DEVI: 1.0000 | 0 refills | Status: DC | PRN

## 2020-08-06 NOTE — Progress Notes (Signed)
Subjective:   Priscilla Young is a 29 y.o. G3P2002 at [redacted]w[redacted]d by early ultrasound being seen today for her first obstetrical visit.  Her obstetrical history is significant for none. Patient does intend to breast feed. Pregnancy history fully reviewed.  Patient reports nausea and vomiting.  HISTORY: OB History  Gravida Para Term Preterm AB Living  3 2 2  0 0 2  SAB TAB Ectopic Multiple Live Births  0 0 0 0 2    # Outcome Date GA Lbr Len/2nd Weight Sex Delivery Anes PTL Lv  3 Current           2 Term 03/28/12 [redacted]w[redacted]d  8 lb 9 oz (3.884 kg) M Vag-Spont None  LIV  1 Term 01/07/11 [redacted]w[redacted]d  6 lb 9 oz (2.977 kg) M Vag-Spont None  LIV    Obstetric Comments  Induced at 37 weeks with first babye due to oligo     Last pap smear was done unsure and was does not recall a history of abnormal paps   Past Medical History:  Diagnosis Date  . Anxiety   . Scoliosis    Past Surgical History:  Procedure Laterality Date  . NO PAST SURGERIES     Family History  Problem Relation Age of Onset  . Hypertension Mother    Social History   Tobacco Use  . Smoking status: Current Some Day Smoker  . Smokeless tobacco: Never Used  Vaping Use  . Vaping Use: Never used  Substance Use Topics  . Alcohol use: Not Currently  . Drug use: No   No Known Allergies Current Outpatient Medications on File Prior to Visit  Medication Sig Dispense Refill  . doxylamine, Sleep, (UNISOM) 25 MG tablet Take 1 tablet (25 mg total) by mouth every 8 (eight) hours as needed. (Patient not taking: Reported on 08/06/2020) 30 tablet 0  . Prenatal Vit-Fe Fumarate-FA (PREPLUS) 27-1 MG TABS Take 1 tablet by mouth daily. (Patient not taking: Reported on 08/06/2020) 30 tablet 13  . pyridOXINE (VITAMIN B-6) 25 MG tablet Take 1 tablet (25 mg total) by mouth every 8 (eight) hours. (Patient not taking: Reported on 08/06/2020) 30 tablet 0   No current facility-administered medications on file prior to visit.    Review of Systems Pertinent  items noted in HPI and remainder of comprehensive ROS otherwise negative.  Exam   Vitals:   08/06/20 1033  BP: 119/73  Pulse: 85  Weight: 146 lb 9.6 oz (66.5 kg)   Fetal Heart Rate (bpm): 150  Physical Exam Vitals and nursing note reviewed. Exam conducted with a chaperone present.  Constitutional:      General: She is not in acute distress. HENT:     Head: Normocephalic.  Cardiovascular:     Rate and Rhythm: Normal rate.  Pulmonary:     Effort: Pulmonary effort is normal.  Abdominal:     Palpations: Abdomen is soft.     Tenderness: There is no abdominal tenderness.  Genitourinary:    Comments:  External: no lesion Vagina: small amount of white discharge Cervix: pink, smooth, no CMT Uterus: 13 weeks size  Adnexa: NT  Skin:    General: Skin is warm and dry.  Neurological:     Mental Status: She is alert and oriented to person, place, and time.  Psychiatric:        Behavior: Behavior normal.     Assessment:   Pregnancy: 14/07/21 Patient Active Problem List   Diagnosis Date Noted  . Scoliosis 08/06/2020  .  Supervision of other normal pregnancy, antepartum 08/05/2020     Plan:  1. Supervision of other normal pregnancy, antepartum - Culture, OB Urine - Genetic Screening - CBC/D/Plt+RPR+Rh+ABO+Rub Ab... - Enroll Patient in Babys[employer]s - Cervicovaginal ancillary only( [place]) - Cytology - PAP( [place]) - [place] MFM OB COMP + 14 WK; Future  2. [redacted] weeks gestation of pregnancy - RX for chewable prenatal due to nausea from current prenatal vitamin - Patient also requesting iron supplement, rx sent to pharmacy   3. Other idiopathic scoliosis, unspecified spinal region - patient reports that she has not had an epidural with her labors because she is worried that the scoliosis may impair the ability to do the epidural properly.    Initial labs drawn. Continue prenatal vitamins, will switch to chewable due to nausea from current vitamins  Genetic Screening  discussed, NIPS: requested. Ultrasound discussed; fetal anatomic survey: ordered. Problem list reviewed and updated. The nature of [facility] with multiple MDs and other Advanced Practice Providers was explained to patient; also emphasized that residents, students are part of our team. Routine obstetric precautions reviewed. 50% of 45 min visit spent in counseling and coordination of care. Return in about 4 weeks (around 09/03/2020).   Thressa Sheller DNP, CNM  08/06/20  11:15 AM

## 2020-08-06 NOTE — Progress Notes (Signed)
Pt presents for NOB visit c/o nausea and vomiting  Pt states Preplus is too big for her to swallow.  Pt unsure last pap smear  Flu vaccine given RD without difficulty

## 2020-08-07 LAB — CYTOLOGY - PAP: Diagnosis: NEGATIVE

## 2020-08-07 LAB — CBC/D/PLT+RPR+RH+ABO+RUB AB...
Antibody Screen: NEGATIVE
Basophils Absolute: 0 10*3/uL (ref 0.0–0.2)
Basos: 1 %
EOS (ABSOLUTE): 0.3 10*3/uL (ref 0.0–0.4)
Eos: 4 %
HCV Ab: 0.2 s/co ratio (ref 0.0–0.9)
HIV Screen 4th Generation wRfx: NONREACTIVE
Hematocrit: 35.4 % (ref 34.0–46.6)
Hemoglobin: 11.6 g/dL (ref 11.1–15.9)
Hepatitis B Surface Ag: NEGATIVE
Immature Grans (Abs): 0 10*3/uL (ref 0.0–0.1)
Immature Granulocytes: 0 %
Lymphocytes Absolute: 1.9 10*3/uL (ref 0.7–3.1)
Lymphs: 26 %
MCH: 26.1 pg — ABNORMAL LOW (ref 26.6–33.0)
MCHC: 32.8 g/dL (ref 31.5–35.7)
MCV: 80 fL (ref 79–97)
Monocytes Absolute: 0.5 10*3/uL (ref 0.1–0.9)
Monocytes: 7 %
Neutrophils Absolute: 4.4 10*3/uL (ref 1.4–7.0)
Neutrophils: 62 %
Platelets: 210 10*3/uL (ref 150–450)
RBC: 4.45 x10E6/uL (ref 3.77–5.28)
RDW: 12 % (ref 11.7–15.4)
RPR Ser Ql: NONREACTIVE
Rh Factor: POSITIVE
Rubella Antibodies, IGG: 1.18 index (ref >0.99)
WBC: 7.1 10*3/uL (ref 3.4–10.8)

## 2020-08-07 LAB — CERVICOVAGINAL ANCILLARY ONLY
Bacterial Vaginitis (gardnerella): POSITIVE — AB
Candida Glabrata: NEGATIVE
Candida Vaginitis: POSITIVE — AB
Chlamydia: NEGATIVE
Comment: NEGATIVE
Comment: NEGATIVE
Comment: NEGATIVE
Comment: NEGATIVE
Comment: NEGATIVE
Comment: NORMAL
Neisseria Gonorrhea: NEGATIVE
Trichomonas: NEGATIVE

## 2020-08-07 LAB — HCV INTERPRETATION

## 2020-08-08 LAB — URINE CULTURE, OB REFLEX

## 2020-08-08 LAB — CULTURE, OB URINE

## 2020-08-19 ENCOUNTER — Encounter: Payer: Self-pay | Admitting: Obstetrics and Gynecology

## 2020-08-31 NOTE — L&D Delivery Note (Signed)
[facility]N [facility] Delivery Note  Priscilla Young is a 30 y.o. G3P3003 s/p SVB at [redacted]w[redacted]d. She was admitted for SROM.   ROM: 9h 39m with clear fluid/ forebag en caul w light MSF GBS Status: pos Maximum Maternal Temperature: 98.7  Labor Progress: Priscilla Young was admitted with SROM ~2030 of clear fluid; she received PCN x 2 doses; she preferred expectant management to fulfill her desire for a waterbirth; ctx began spont and she got into the tub at 7cm and progressed to en caul (forebag) delivery shortly thereafter.  Delivery Date/Time: June 13th at 0532 Delivery: Called to room and patient was complete and pushing. Head delivered LOA. Nuchal cord present x 1, reduced after delivery. Shoulder and body delivered in usual fashion. Infant with spontaneous cry, placed on mother's abdomen, dried and stimulated. Cord clamped x 2 after 1-minute delay, and cut by family member. Cord blood drawn. Placenta delivered spontaneously with gentle cord traction. Fundus firm with massage and Pitocin. Labia, perineum, vagina, and cervix inspected and found to be intact. Within of delivery she was passing some clots- bimanual exam to sweep the vault and cytotec PR given for good control.   Placenta: spont, intact, to L&D Complications: none Lacerations: none EBL: 559cc Analgesia: none  Postpartum Planning [x]  message to sent to schedule follow-up   Infant: girl  APGARs 9/9  3682g (8lb 1.9oz)  11/9, CNM  02/10/2021 7:32 AM

## 2020-09-17 ENCOUNTER — Other Ambulatory Visit: Payer: Self-pay | Admitting: Advanced Practice Midwife

## 2020-09-17 ENCOUNTER — Other Ambulatory Visit: Payer: Self-pay

## 2020-09-17 ENCOUNTER — Ambulatory Visit: Payer: Medicaid Other | Attending: Advanced Practice Midwife

## 2020-09-17 DIAGNOSIS — Z3480 Encounter for supervision of other normal pregnancy, unspecified trimester: Secondary | ICD-10-CM | POA: Insufficient documentation

## 2020-09-17 DIAGNOSIS — Z148 Genetic carrier of other disease: Secondary | ICD-10-CM

## 2020-09-30 ENCOUNTER — Ambulatory Visit: Payer: Medicaid Other | Attending: Obstetrics and Gynecology

## 2020-10-14 ENCOUNTER — Other Ambulatory Visit: Payer: Self-pay

## 2020-10-14 ENCOUNTER — Encounter: Payer: Medicaid Other | Admitting: Advanced Practice Midwife

## 2020-10-14 ENCOUNTER — Ambulatory Visit (INDEPENDENT_AMBULATORY_CARE_PROVIDER_SITE_OTHER): Payer: Medicaid Other | Admitting: Advanced Practice Midwife

## 2020-10-14 VITALS — BP 102/61 | HR 87 | Wt 157.0 lb

## 2020-10-14 DIAGNOSIS — O219 Vomiting of pregnancy, unspecified: Secondary | ICD-10-CM

## 2020-10-14 DIAGNOSIS — Z3480 Encounter for supervision of other normal pregnancy, unspecified trimester: Secondary | ICD-10-CM

## 2020-10-14 DIAGNOSIS — Z3A23 23 weeks gestation of pregnancy: Secondary | ICD-10-CM

## 2020-10-14 MED ORDER — DOXYLAMINE-PYRIDOXINE 10-10 MG PO TBEC: DELAYED_RELEASE_TABLET | ORAL | 5 refills | Status: DC

## 2020-10-14 NOTE — Patient Instructions (Signed)

## 2020-10-14 NOTE — Progress Notes (Signed)
   PRENATAL VISIT NOTE  Subjective:  Priscilla Young is a 30 y.o. G3P2002 at [redacted]w[redacted]d being seen today for ongoing prenatal care.  She is currently monitored for the following issues for this low-risk pregnancy and has Supervision of other normal pregnancy, antepartum and Scoliosis on their problem list.  Patient reports no complaints.  Contractions: Not present. Vag. Bleeding: None.  Movement: Present. Denies leaking of fluid.   The following portions of the patient's history were reviewed and updated as appropriate: allergies, current medications, past family history, past medical history, past social history, past surgical history and problem list.   Objective:   Vitals:   10/14/20 1329  BP: 102/61  Pulse: 87  Weight: 157 lb (71.2 kg)    Fetal Status: Fetal Heart Rate (bpm): 135 Fundal Height: 23 cm Movement: Present     General:  Alert, oriented and cooperative. Patient is in no acute distress.  Skin: Skin is warm and dry. No rash noted.   Cardiovascular: Normal heart rate noted  Respiratory: Normal respiratory effort, no problems with respiration noted  Abdomen: Soft, gravid, appropriate for gestational age.  Pain/Pressure: Present     Pelvic: Cervical exam deferred        Extremities: Normal range of motion.  Edema: None  Mental Status: Normal mood and affect. Normal behavior. Normal judgment and thought content.   Assessment and Plan:  Pregnancy: G3P2002 at [redacted]w[redacted]d 1. Supervision of other normal pregnancy, antepartum --Anticipatory guidance about next visits/weeks of pregnancy given. --Next visit in 4 weeks in office for GTT  2. [redacted] weeks gestation of pregnancy   3. Nausea and vomiting during pregnancy --Discussed dietary changes, pt with daily n/v, is not taking any medications.  - Doxylamine-Pyridoxine (DICLEGIS) 10-10 MG TBEC; Take 2 tabs at bedtime. If needed, add another tab in the morning. If needed, add another tab in the afternoon, up to 4 tabs/day.  Dispense: 100  tablet; Refill: 5  Preterm labor symptoms and general obstetric precautions including but not limited to vaginal bleeding, contractions, leaking of fluid and fetal movement were reviewed in detail with the patient. Please refer to After Visit Summary for other counseling recommendations.   Return in about 4 weeks (around 11/11/2020).  No future appointments.  Sharen Counter, CNM

## 2020-11-11 ENCOUNTER — Other Ambulatory Visit: Payer: Medicaid Other

## 2020-11-11 ENCOUNTER — Encounter: Payer: Medicaid Other | Admitting: Advanced Practice Midwife

## 2020-11-25 ENCOUNTER — Ambulatory Visit (INDEPENDENT_AMBULATORY_CARE_PROVIDER_SITE_OTHER): Payer: Medicaid Other | Admitting: Advanced Practice Midwife

## 2020-11-25 ENCOUNTER — Other Ambulatory Visit: Payer: Medicaid Other

## 2020-11-25 ENCOUNTER — Encounter: Payer: Self-pay | Admitting: Advanced Practice Midwife

## 2020-11-25 ENCOUNTER — Other Ambulatory Visit: Payer: Self-pay

## 2020-11-25 VITALS — BP 105/73 | HR 96 | Wt 158.0 lb

## 2020-11-25 DIAGNOSIS — R102 Pelvic and perineal pain: Secondary | ICD-10-CM

## 2020-11-25 DIAGNOSIS — Z3480 Encounter for supervision of other normal pregnancy, unspecified trimester: Secondary | ICD-10-CM

## 2020-11-25 DIAGNOSIS — O99013 Anemia complicating pregnancy, third trimester: Secondary | ICD-10-CM

## 2020-11-25 DIAGNOSIS — O26899 Other specified pregnancy related conditions, unspecified trimester: Secondary | ICD-10-CM

## 2020-11-25 DIAGNOSIS — O26843 Uterine size-date discrepancy, third trimester: Secondary | ICD-10-CM

## 2020-11-25 DIAGNOSIS — O219 Vomiting of pregnancy, unspecified: Secondary | ICD-10-CM

## 2020-11-25 MED ORDER — FERROUS SULFATE 325 (65 FE) MG PO TABS: 325.0000 mg | ORAL_TABLET | ORAL | 11 refills | Status: DC

## 2020-11-25 MED ORDER — ONDANSETRON HCL 4 MG PO TABS: 4.0000 mg | ORAL_TABLET | Freq: Three times a day (TID) | ORAL | 0 refills | Status: DC | PRN

## 2020-11-25 MED ORDER — PRENATAL 19 29-1 MG PO CHEW: 1.0000 | CHEWABLE_TABLET | Freq: Every day | ORAL | 11 refills | Status: AC

## 2020-11-25 NOTE — Progress Notes (Signed)
ROB 29 wks   GTT today Tdap offered pt will decide. .  Pt states she never received Rx for iron.  CC: Nausea and vomiting never received Rx for Nausea.

## 2020-11-25 NOTE — Progress Notes (Signed)
   PRENATAL VISIT NOTE  Subjective:  Priscilla Young is a 30 y.o. G3P2002 at [redacted]w[redacted]d being seen today for ongoing prenatal care.  She is currently monitored for the following issues for this low-risk pregnancy and has Supervision of other normal pregnancy, antepartum and Scoliosis on their problem list.  Patient reports occasional contractions.  Contractions: Irritability. Vag. Bleeding: None.  Movement: Present. Denies leaking of fluid.   The following portions of the patient's history were reviewed and updated as appropriate: allergies, current medications, past family history, past medical history, past social history, past surgical history and problem list.   Objective:   Vitals:   11/25/20 1005  BP: 105/73  Pulse: 96  Weight: 158 lb (71.7 kg)    Fetal Status: Fetal Heart Rate (bpm): 136   Movement: Present     General:  Alert, oriented and cooperative. Patient is in no acute distress.  Skin: Skin is warm and dry. No rash noted.   Cardiovascular: Normal heart rate noted  Respiratory: Normal respiratory effort, no problems with respiration noted  Abdomen: Soft, gravid, appropriate for gestational age.  Pain/Pressure: Present     Pelvic: Cervical exam deferred        Extremities: Normal range of motion.  Edema: Trace  Mental Status: Normal mood and affect. Normal behavior. Normal judgment and thought content.   Assessment and Plan:  Pregnancy: G3P2002 at [redacted]w[redacted]d  1. Supervision of other normal pregnancy, antepartum --Anticipatory guidance about next visits/weeks of pregnancy given. --Next visit in 2 weeks in office - Glucose Tolerance, 2 Hours w/1 Hour - CBC - RPR - HIV Antibody (routine testing w rflx) - Prenatal Vit-Fe Fumarate-FA (PRENATAL 19) 29-1 MG CHEW; Chew 1 tablet by mouth daily.  Dispense: 30 tablet; Refill: 11  2. Nausea and vomiting during pregnancy --Pharmacy did not have  Diclegis, pt has not taken. Nausea improved, not every day but would still like to treat  occasionally. - ondansetron (ZOFRAN) 4 MG tablet; Take 1 tablet (4 mg total) by mouth every 8 (eight) hours as needed for nausea or vomiting.  Dispense: 20 tablet; Refill: 0  3. Anemia affecting pregnancy in third trimester --Pt Hgb 11.2 at new OB pt pt reports dizziness, Rx for ferrous sulfate daily sent earlier but pt reports pharmacy did not have. --Confirmed pharmacy and ferrous sulfate every other day Rx sent --Reviewed adding high fiber foods and staying hydrated to prevent constipation - ferrous sulfate 325 (65 FE) MG tablet; Take 1 tablet (325 mg total) by mouth every other day.  Dispense: 30 tablet; Refill: 11  4. Pain of round ligament affecting pregnancy, antepartum --Rest/ice/heat/warm bath/Tylenol/pregnancy support belt  5. Uterine size date discrepancy pregnancy, third trimester --Pt previous baby 8+lbs, and pt sitting up during measurement in office today but FH 31 at 29weeks, will get EFW  - [place] MFM OB FOLLOW UP; Future  Preterm labor symptoms and general obstetric precautions including but not limited to vaginal bleeding, contractions, leaking of fluid and fetal movement were reviewed in detail with the patient. Please refer to After Visit Summary for other counseling recommendations.   Return in about 2 weeks (around 12/09/2020).  Future Appointments  Date Time Provider Department Center  12/09/2020 10:35 AM [place], Wilmer Floor, CNM [facility] None    Sharen Counter, CNM

## 2020-11-26 ENCOUNTER — Encounter: Payer: Self-pay | Admitting: Obstetrics and Gynecology

## 2020-11-26 ENCOUNTER — Encounter: Payer: Self-pay | Admitting: Advanced Practice Midwife

## 2020-11-26 DIAGNOSIS — D563 Thalassemia minor: Secondary | ICD-10-CM | POA: Insufficient documentation

## 2020-11-26 LAB — SYPHILIS: RPR W/REFLEX TO RPR TITER AND TREPONEMAL ANTIBODIES, TRADITIONAL SCREENING AND DIAGNOSIS ALGORITHM: RPR Ser Ql: NONREACTIVE

## 2020-11-26 LAB — CBC
Hematocrit: 32.1 % — ABNORMAL LOW (ref 34.0–46.6)
Hemoglobin: 10.5 g/dL — ABNORMAL LOW (ref 11.1–15.9)
MCH: 25.7 pg — ABNORMAL LOW (ref 26.6–33.0)
MCHC: 32.7 g/dL (ref 31.5–35.7)
MCV: 79 fL (ref 79–97)
Platelets: 173 10*3/uL (ref 150–450)
RBC: 4.09 x10E6/uL (ref 3.77–5.28)
RDW: 12.6 % (ref 11.7–15.4)
WBC: 7.6 10*3/uL (ref 3.4–10.8)

## 2020-11-26 LAB — GLUCOSE TOLERANCE, 2 HOURS W/ 1HR
Glucose, 1 hour: 112 mg/dL (ref 65–179)
Glucose, 2 hour: 108 mg/dL (ref 65–152)
Glucose, Fasting: 77 mg/dL (ref 65–91)

## 2020-11-26 LAB — HIV ANTIBODY (ROUTINE TESTING W REFLEX): HIV Screen 4th Generation wRfx: NONREACTIVE

## 2020-12-02 ENCOUNTER — Ambulatory Visit: Payer: Medicaid Other | Attending: Advanced Practice Midwife

## 2020-12-02 ENCOUNTER — Ambulatory Visit: Payer: Medicaid Other | Admitting: *Deleted

## 2020-12-02 ENCOUNTER — Encounter: Payer: Self-pay | Admitting: *Deleted

## 2020-12-02 ENCOUNTER — Other Ambulatory Visit: Payer: Self-pay

## 2020-12-02 VITALS — BP 114/61 | HR 72

## 2020-12-02 DIAGNOSIS — D563 Thalassemia minor: Secondary | ICD-10-CM | POA: Insufficient documentation

## 2020-12-02 DIAGNOSIS — Z148 Genetic carrier of other disease: Secondary | ICD-10-CM

## 2020-12-02 DIAGNOSIS — O358XX0 Maternal care for other (suspected) fetal abnormality and damage, not applicable or unspecified: Secondary | ICD-10-CM | POA: Diagnosis not present

## 2020-12-02 DIAGNOSIS — Z3A30 30 weeks gestation of pregnancy: Secondary | ICD-10-CM

## 2020-12-02 DIAGNOSIS — O26843 Uterine size-date discrepancy, third trimester: Secondary | ICD-10-CM | POA: Insufficient documentation

## 2020-12-09 ENCOUNTER — Ambulatory Visit (INDEPENDENT_AMBULATORY_CARE_PROVIDER_SITE_OTHER): Payer: Medicaid Other | Admitting: Advanced Practice Midwife

## 2020-12-09 ENCOUNTER — Other Ambulatory Visit: Payer: Self-pay

## 2020-12-09 VITALS — BP 102/68 | HR 79 | Wt 164.0 lb

## 2020-12-09 DIAGNOSIS — O99013 Anemia complicating pregnancy, third trimester: Secondary | ICD-10-CM

## 2020-12-09 DIAGNOSIS — Z3480 Encounter for supervision of other normal pregnancy, unspecified trimester: Secondary | ICD-10-CM

## 2020-12-09 DIAGNOSIS — Z3483 Encounter for supervision of other normal pregnancy, third trimester: Secondary | ICD-10-CM

## 2020-12-09 NOTE — Progress Notes (Signed)
   PRENATAL VISIT NOTE  Subjective:  Priscilla Young is a 30 y.o. G3P2002 at [redacted]w[redacted]d being seen today for ongoing prenatal care.  She is currently monitored for the following issues for this low-risk pregnancy and has Supervision of other normal pregnancy, antepartum; Scoliosis; and Alpha thalassemia silent carrier on their problem list.  Patient reports no complaints.  Contractions: Irritability. Vag. Bleeding: None.  Movement: Present. Denies leaking of fluid.   The following portions of the patient's history were reviewed and updated as appropriate: allergies, current medications, past family history, past medical history, past social history, past surgical history and problem list.   Objective:   Vitals:   12/09/20 1042  BP: 102/68  Pulse: 79  Weight: 164 lb (74.4 kg)    Fetal Status: Fetal Heart Rate (bpm): 136   Movement: Present     General:  Alert, oriented and cooperative. Patient is in no acute distress.  Skin: Skin is warm and dry. No rash noted.   Cardiovascular: Normal heart rate noted  Respiratory: Normal respiratory effort, no problems with respiration noted  Abdomen: Soft, gravid, appropriate for gestational age.  Pain/Pressure: Present     Pelvic: Cervical exam deferred        Extremities: Normal range of motion.  Edema: None  Mental Status: Normal mood and affect. Normal behavior. Normal judgment and thought content.   Assessment and Plan:  Pregnancy: G3P2002 at [redacted]w[redacted]d 1. Supervision of other normal pregnancy, antepartum --Anticipatory guidance about next visits/weeks of pregnancy given. --Interest in waterbirth. Had previous 2 babies unmedicated.  Low risk, no barriers to waterbirth at this time.  Discussed how birth is unpredictable and contraindications can develop in pregnancy or labor.  Pt states understanding. --Info about class and general WB info given today --Next appt with midwife in 2 weeks  2. Anemia affecting pregnancy in third trimester --Hgb 10.5  at 28 weeks, taking PNV, iron every other day  Preterm labor symptoms and general obstetric precautions including but not limited to vaginal bleeding, contractions, leaking of fluid and fetal movement were reviewed in detail with the patient. Please refer to After Visit Summary for other counseling recommendations.   No follow-ups on file.  No future appointments.  Sharen Counter, CNM

## 2020-12-09 NOTE — Patient Instructions (Addendum)
Considering Waterbirth? Guide for patients at [facility]) Why consider waterbirth? . Gentle birth for babies  . Less pain medicine used in labor  . May allow for passive descent/less pushing  . May reduce perineal tears  . More mobility and instinctive maternal position changes  . Increased maternal relaxation   Is waterbirth safe? What are the risks of infection, drowning or other complications? . Infection:  Marland Kitchen Very low risk (3.7 % for tub vs 4.8% for bed)  . 7 in 10 waterbirths with documented infection  . Poorly cleaned equipment most common cause  . Slightly lower group B strep transmission rate  . Drowning  . Maternal:  . Very low risk  . Related to seizures or fainting  . Newborn:  Marland Kitchen Very low risk. No evidence of increased risk of respiratory problems in multiple large studies  . Physiological protection from breathing under water  . Avoid underwater birth if there are any fetal complications  . Once baby's head is out of the water, keep it out.  . Birth complication  . Some reports of cord trauma, but risk decreased by bringing baby to surface gradually  . No evidence of increased risk of shoulder dystocia. Mothers can usually change positions faster in water than in a bed, possibly aiding the maneuvers to free the shoulder.   There are 2 things you MUST do to have a waterbirth with [facility]: 1. Attend a waterbirth class at [place] at [facility]   a. 3rd Wednesday of every month from 7-9 pm (virtual during [place]) b. Free c. Register online at [email or web address] or [email or web address] or by calling [phone] d. Bring [place] the certificate from the class to your prenatal appointment or send via MyChart 2. Meet with a midwife at 36 weeks* to see if you can still plan a waterbirth and to sign the consent.   *We also recommend that you schedule as many of your prenatal visits with a midwife as possible.    Helpful  information: . You may want to bring a bathing suit top to the hospital to wear during labor but this is optional.  All other supplies are provided by the hospital. . Please arrive at the hospital with signs of active labor, and do not wait at home until late in labor. It takes 45 min- 2 hours for COVID testing, fetal monitoring, and check in to your room to take place, plus transport and filling of the waterbirth tub.    Things that would prevent you from having a waterbirth: . Unknown or Positive COVID-19 diagnosis upon admission to hospital* . Premature, <37wks  . Previous cesarean birth  . Presence of thick meconium-stained fluid  . Multiple gestation (Twins, triplets, etc.)  . Uncontrolled diabetes or gestational diabetes requiring medication  . Hypertension diagnosed in pregnancy or preexisting hypertension (gestational hypertension, preeclampsia, or chronic hypertension) . Fetal growth restriction (your baby measures less than 10th percentile on ultrasound) . Heavy vaginal bleeding  . Non-reassuring fetal heart rate  . Active infection (MRSA, etc.). Group B Strep is NOT a contraindication for waterbirth.  . If your labor has to be induced and induction method requires continuous monitoring of the baby's heart rate  . Other risks/issues identified by your obstetrical provider   Please remember that birth is unpredictable. Under certain unforeseeable circumstances your provider may advise against giving birth in the tub. These decisions will be made on a case-by-case basis and with the safety of you  and your baby as our highest priority.   *Please remember that in order to have a waterbirth, you must test Negative to COVID-19 upon admission to the hospital.  Updated 12/09/20  

## 2020-12-09 NOTE — Progress Notes (Signed)
ROB [redacted]w[redacted]d  CC: None  *Pt wants to discuss water birth.

## 2020-12-23 ENCOUNTER — Other Ambulatory Visit: Payer: Self-pay

## 2020-12-23 ENCOUNTER — Ambulatory Visit (INDEPENDENT_AMBULATORY_CARE_PROVIDER_SITE_OTHER): Payer: Medicaid Other | Admitting: Advanced Practice Midwife

## 2020-12-23 VITALS — BP 101/66 | HR 84 | Wt 166.0 lb

## 2020-12-23 DIAGNOSIS — Z3A33 33 weeks gestation of pregnancy: Secondary | ICD-10-CM

## 2020-12-23 DIAGNOSIS — Z3480 Encounter for supervision of other normal pregnancy, unspecified trimester: Secondary | ICD-10-CM

## 2020-12-23 DIAGNOSIS — K439 Ventral hernia without obstruction or gangrene: Secondary | ICD-10-CM

## 2020-12-23 NOTE — Patient Instructions (Signed)

## 2020-12-23 NOTE — Progress Notes (Signed)
   PRENATAL VISIT NOTE  Subjective:  Priscilla Young is a 30 y.o. G3P2002 at [redacted]w[redacted]d being seen today for ongoing prenatal care.  She is currently monitored for the following issues for this low-risk pregnancy and has Supervision of other normal pregnancy, antepartum; Scoliosis; and Alpha thalassemia silent carrier on their problem list.  Patient reports part of her stomach above her belly button is sticking out sometims.  Contractions: Irritability. Vag. Bleeding: None.  Movement: Present. Denies leaking of fluid.   The following portions of the patient's history were reviewed and updated as appropriate: allergies, current medications, past family history, past medical history, past social history, past surgical history and problem list.   Objective:   Vitals:   12/23/20 0859  BP: 101/66  Pulse: 84  Weight: 166 lb (75.3 kg)    Fetal Status: Fetal Heart Rate (bpm): 131 Fundal Height: 33 cm Movement: Present     General:  Alert, oriented and cooperative. Patient is in no acute distress.  Skin: Skin is warm and dry. No rash noted.   Cardiovascular: Normal heart rate noted  Respiratory: Normal respiratory effort, no problems with respiration noted  Abdomen: Soft, gravid, appropriate for gestational age.  Pain/Pressure: Present     Pelvic: Cervical exam deferred        Extremities: Normal range of motion.  Edema: None  Mental Status: Normal mood and affect. Normal behavior. Normal judgment and thought content.   Assessment and Plan:  Pregnancy: G3P2002 at [redacted]w[redacted]d 1. Supervision of other normal pregnancy, antepartum --Anticipatory guidance about next visits/weeks of pregnancy given. --Next visit in 3 weeks for GBS, waterbirth consent  2. [redacted] weeks gestation of pregnancy   3. Ventral hernia without obstruction or gangrene --Mild hernia above umbilicus, only seen with abdominal pressure. No pain to palpation, reduces easily.  --Teaching done, may worsen with pregnancy, may improve  postpartum but if not can consider surgical repair.  Rare but possible incarceration which is an emergency.  Preterm labor symptoms and general obstetric precautions including but not limited to vaginal bleeding, contractions, leaking of fluid and fetal movement were reviewed in detail with the patient. Please refer to After Visit Summary for other counseling recommendations.   Return in about 3 weeks (around 01/13/2021).  Future Appointments  Date Time Provider Department Center  01/13/2021  8:35 AM Leftwich-Kirby, Wilmer Floor, CNM [facility] None    Sharen Counter, CNM

## 2020-12-23 NOTE — Progress Notes (Signed)
ROB c/o pressure and navel protruding x 3 days.

## 2021-01-13 ENCOUNTER — Other Ambulatory Visit (HOSPITAL_COMMUNITY)
Admission: RE | Admit: 2021-01-13 | Discharge: 2021-01-13 | Disposition: A | Discharge status: Home / Self Care | Payer: Medicaid Other | Source: Ambulatory Visit | Attending: Advanced Practice Midwife | Admitting: Advanced Practice Midwife

## 2021-01-13 ENCOUNTER — Ambulatory Visit (INDEPENDENT_AMBULATORY_CARE_PROVIDER_SITE_OTHER): Payer: Medicaid Other | Admitting: Advanced Practice Midwife

## 2021-01-13 ENCOUNTER — Other Ambulatory Visit: Payer: Self-pay

## 2021-01-13 VITALS — BP 105/75 | HR 84 | Wt 169.2 lb

## 2021-01-13 DIAGNOSIS — Z3480 Encounter for supervision of other normal pregnancy, unspecified trimester: Secondary | ICD-10-CM | POA: Diagnosis not present

## 2021-01-13 DIAGNOSIS — Z3A36 36 weeks gestation of pregnancy: Secondary | ICD-10-CM

## 2021-01-13 NOTE — Patient Instructions (Signed)
Things to Try After 37 weeks to Encourage Labor/Get Ready for Labor:   1.  Try the Miles Circuit at www.milescircuit.com daily to improve baby's position and encourage the onset of labor.  2. Walk a little and rest a little every day.  Change positions often.  3. Cervical Ripening: May try one or both a. Red Raspberry Leaf capsules or tea:  two 300mg or 400mg tablets with each meal, 2-3 times a day, or 1-3 cups of tea daily  Potential Side Effects Of Raspberry Leaf:  Most women do not experience any side effects from drinking raspberry leaf tea. However, nausea and loose stools are possible   b. Evening Primrose Oil capsules: take 1 capsule by mouth and place one capsule in the vagina every night.    Some of the potential side effects:  Upset stomach  Loose stools or diarrhea  Headaches  Nausea  4. Sex can also help the cervix ripen and encourage labor onset.    Labor Precautions Reasons to come to MAU at [facility]:  1.  Contractions are  5 minutes apart or less, each last 1 minute, these have been going on for 1-2 hours, and you cannot walk or talk during them 2.  You have a large gush of fluid, or a trickle of fluid that will not stop and you have to wear a pad 3.  You have bleeding that is bright red, heavier than spotting--like menstrual bleeding (spotting can be normal in early labor or after a check of your cervix) 4.  You do not feel the baby moving like he/she normally does 

## 2021-01-13 NOTE — Progress Notes (Signed)
   PRENATAL VISIT NOTE  Subjective:  Priscilla Young is a 30 y.o. G3P2002 at [redacted]w[redacted]d being seen today for ongoing prenatal care.  She is currently monitored for the following issues for this low-risk pregnancy and has Supervision of other normal pregnancy, antepartum; Scoliosis; Alpha thalassemia silent carrier; and Ventral hernia without obstruction or gangrene on their problem list.  Patient reports occasional contractions.  Contractions: Irritability. Vag. Bleeding: None.  Movement: Present. Denies leaking of fluid.   The following portions of the patient's history were reviewed and updated as appropriate: allergies, current medications, past family history, past medical history, past social history, past surgical history and problem list.   Objective:   Vitals:   01/13/21 0844  BP: 105/75  Pulse: 84  Weight: 169 lb 3.2 oz (76.7 kg)    Fetal Status: Fetal Heart Rate (bpm): 138 Fundal Height: 36 cm Movement: Present  Presentation: Vertex  General:  Alert, oriented and cooperative. Patient is in no acute distress.  Skin: Skin is warm and dry. No rash noted.   Cardiovascular: Normal heart rate noted  Respiratory: Normal respiratory effort, no problems with respiration noted  Abdomen: Soft, gravid, appropriate for gestational age.  Pain/Pressure: Present     Pelvic: Cervical exam performed in the presence of a chaperone Dilation: 1 Effacement (%): 50 Station: -2  Extremities: Normal range of motion.  Edema: None  Mental Status: Normal mood and affect. Normal behavior. Normal judgment and thought content.   Assessment and Plan:  Pregnancy: G3P2002 at [redacted]w[redacted]d 1. Supervision of other normal pregnancy, antepartum --Anticipatory guidance about next visits/weeks of pregnancy given. --Next visit in 1 week --Waterbirth consent completed today, no barriers to WB today. Pt aware that this can change. - Strep Gp B Culture+Rflx - Cervicovaginal ancillary only( [place])  2. [redacted] weeks  gestation of pregnancy   Preterm labor symptoms and general obstetric precautions including but not limited to vaginal bleeding, contractions, leaking of fluid and fetal movement were reviewed in detail with the patient. Please refer to After Visit Summary for other counseling recommendations.   Return in about 1 week (around 01/20/2021).  Future Appointments  Date Time Provider Department Center  01/20/2021  8:35 AM Leftwich-Kirby, Wilmer Floor, CNM [facility] None    Sharen Counter, CNM

## 2021-01-14 LAB — CERVICOVAGINAL ANCILLARY ONLY
Chlamydia: NEGATIVE
Comment: NEGATIVE
Comment: NORMAL
Neisseria Gonorrhea: NEGATIVE

## 2021-01-16 LAB — CULTURE, BETA STREP (GROUP B ONLY): Strep Gp B Culture: POSITIVE — AB

## 2021-01-20 ENCOUNTER — Other Ambulatory Visit: Payer: Self-pay

## 2021-01-20 ENCOUNTER — Ambulatory Visit (INDEPENDENT_AMBULATORY_CARE_PROVIDER_SITE_OTHER): Payer: Medicaid Other | Admitting: Family Medicine

## 2021-01-20 DIAGNOSIS — O99820 Streptococcus B carrier state complicating pregnancy: Secondary | ICD-10-CM

## 2021-01-20 DIAGNOSIS — Z3480 Encounter for supervision of other normal pregnancy, unspecified trimester: Secondary | ICD-10-CM

## 2021-01-20 NOTE — Progress Notes (Signed)
   PRENATAL VISIT NOTE  Subjective:  Priscilla Young is a 30 y.o. G3P2002 at [redacted]w[redacted]d being seen today for ongoing prenatal care.  She is currently monitored for the following issues for this low-risk pregnancy and has Supervision of other normal pregnancy, antepartum; Scoliosis; Alpha thalassemia silent carrier; Ventral hernia without obstruction or gangrene; and Group B Streptococcus carrier, +RV culture, currently pregnant on their problem list.  Patient reports no complaints.  Contractions: Irritability. Vag. Bleeding: None.  Movement: Present. Denies leaking of fluid.   The following portions of the patient's history were reviewed and updated as appropriate: allergies, current medications, past family history, past medical history, past social history, past surgical history and problem list.   Objective:   Vitals:   01/20/21 0832  BP: 108/71  Pulse: 73  Weight: 168 lb (76.2 kg)    Fetal Status: Fetal Heart Rate (bpm): 128 Fundal Height: 36 cm Movement: Present  Presentation: Vertex  General:  Alert, oriented and cooperative. Patient is in no acute distress.  Skin: Skin is warm and dry. No rash noted.   Cardiovascular: Normal heart rate noted  Respiratory: Normal respiratory effort, no problems with respiration noted  Abdomen: Soft, gravid, appropriate for gestational age.  Pain/Pressure: Present     Pelvic: Cervical exam deferred        Extremities: Normal range of motion.  Edema: None  Mental Status: Normal mood and affect. Normal behavior. Normal judgment and thought content.   Assessment and Plan:  Pregnancy: G3P2002 at [redacted]w[redacted]d 1. Supervision of other normal pregnancy, antepartum Has water birth class paper  2. Group B Streptococcus carrier, +RV culture, currently pregnant Will need treatment in labor--discussed with patient.  Preterm labor symptoms and general obstetric precautions including but not limited to vaginal bleeding, contractions, leaking of fluid and fetal  movement were reviewed in detail with the patient. Please refer to After Visit Summary for other counseling recommendations.   Return in 1 week (on 01/27/2021).  Future Appointments  Date Time Provider Department Center  01/28/2021  9:30 AM Brock Bad, MD [facility] None    Reva Bores, MD

## 2021-01-20 NOTE — Progress Notes (Signed)
OB reports no complaints today.

## 2021-01-20 NOTE — Patient Instructions (Signed)

## 2021-01-28 ENCOUNTER — Other Ambulatory Visit: Payer: Self-pay

## 2021-01-28 ENCOUNTER — Ambulatory Visit (INDEPENDENT_AMBULATORY_CARE_PROVIDER_SITE_OTHER): Payer: Medicaid Other | Admitting: Obstetrics

## 2021-01-28 ENCOUNTER — Encounter: Payer: Self-pay | Admitting: Obstetrics

## 2021-01-28 VITALS — Wt 166.5 lb

## 2021-01-28 DIAGNOSIS — Z3480 Encounter for supervision of other normal pregnancy, unspecified trimester: Secondary | ICD-10-CM

## 2021-01-28 DIAGNOSIS — K439 Ventral hernia without obstruction or gangrene: Secondary | ICD-10-CM

## 2021-01-28 DIAGNOSIS — O26843 Uterine size-date discrepancy, third trimester: Secondary | ICD-10-CM

## 2021-01-28 DIAGNOSIS — O99820 Streptococcus B carrier state complicating pregnancy: Secondary | ICD-10-CM

## 2021-01-28 DIAGNOSIS — O99013 Anemia complicating pregnancy, third trimester: Secondary | ICD-10-CM

## 2021-01-28 NOTE — Progress Notes (Signed)
Subjective:  Priscilla Young is a 30 y.o. G3P2002 at [redacted]w[redacted]d being seen today for ongoing prenatal care.  She is currently monitored for the following issues for this low-risk pregnancy and has Supervision of other normal pregnancy, antepartum; Scoliosis; Alpha thalassemia silent carrier; Ventral hernia without obstruction or gangrene; and Group B Streptococcus carrier, +RV culture, currently pregnant on their problem list.  Patient reports no complaints.  Contractions: Irritability. Vag. Bleeding: None.  Movement: Present. Denies leaking of fluid.   The following portions of the patient's history were reviewed and updated as appropriate: allergies, current medications, past family history, past medical history, past social history, past surgical history and problem list. Problem list updated.  Objective:   Vitals:   01/28/21 0958  Weight: 166 lb 8 oz (75.5 kg)    Fetal Status:     Movement: Present     General:  Alert, oriented and cooperative. Patient is in no acute distress.  Skin: Skin is warm and dry. No rash noted.   Cardiovascular: Normal heart rate noted  Respiratory: Normal respiratory effort, no problems with respiration noted  Abdomen: Soft, gravid, appropriate for gestational age. Pain/Pressure: Present     Pelvic:  Cervical exam deferred        Extremities: Normal range of motion.  Edema: None  Mental Status: Normal mood and affect. Normal behavior. Normal judgment and thought content.   Urinalysis:      Assessment and Plan:  Pregnancy: G3P2002 at [redacted]w[redacted]d  1. Supervision of other normal pregnancy, antepartum  2. Group B Streptococcus carrier, +RV culture, currently pregnant - treat in labor  3. Anemia affecting pregnancy in third trimester  4. Uterine size date discrepancy pregnancy, third trimester - normal EFW at 30 weeks  5. Ventral hernia without obstruction or gangrene - clinically stable   Term labor symptoms and general obstetric precautions including but  not limited to vaginal bleeding, contractions, leaking of fluid and fetal movement were reviewed in detail with the patient. Please refer to After Visit Summary for other counseling recommendations .  Return in about 1 week (around 02/04/2021) for ROB.   Brock Bad, MD  01/28/21

## 2021-01-28 NOTE — Progress Notes (Signed)
Patient presents for ROB. Patient has no concerns. She desires a cervix check.

## 2021-02-01 ENCOUNTER — Encounter (HOSPITAL_COMMUNITY): Payer: Self-pay | Admitting: Obstetrics and Gynecology

## 2021-02-01 ENCOUNTER — Inpatient Hospital Stay (HOSPITAL_COMMUNITY)
Admission: AD | Admit: 2021-02-01 | Discharge: 2021-02-01 | Disposition: A | Discharge status: Home / Self Care | Payer: Medicaid Other | Attending: Obstetrics and Gynecology | Admitting: Obstetrics and Gynecology

## 2021-02-01 ENCOUNTER — Other Ambulatory Visit: Payer: Self-pay

## 2021-02-01 DIAGNOSIS — O479 False labor, unspecified: Secondary | ICD-10-CM

## 2021-02-01 DIAGNOSIS — Z3493 Encounter for supervision of normal pregnancy, unspecified, third trimester: Secondary | ICD-10-CM | POA: Insufficient documentation

## 2021-02-01 DIAGNOSIS — Z3A38 38 weeks gestation of pregnancy: Secondary | ICD-10-CM | POA: Insufficient documentation

## 2021-02-01 LAB — AMNISURE RUPTURE OF MEMBRANE (ROM) NOT AT ARMC: Amnisure ROM: NEGATIVE

## 2021-02-01 NOTE — MAU Provider Note (Signed)
Priscilla Young 30 y.o. [redacted]w[redacted]d Comes to MAU with possible ROB, but history describes slight wetness so will check Amnisure for verification of ROM.  Amnisure is negative. RN cervical exam - 1 cm and unchanged from cervical exam in the office  Assessment Not in labor and no ROM  Plan Discharge home  Nolene Bernheim, RN, MSN, NP-BC Nurse Practitioner, Biochemist, clinical for [employer], [facility] 02/01/2021 1:39 PM

## 2021-02-01 NOTE — MAU Note (Signed)
Feeling sharp pains down abd into vagina, contractions in her back and feels like her vagina is going to fall out. No bleeding, ? Leaking- "feeling moist".

## 2021-02-04 ENCOUNTER — Ambulatory Visit (INDEPENDENT_AMBULATORY_CARE_PROVIDER_SITE_OTHER): Payer: Medicaid Other | Admitting: Obstetrics and Gynecology

## 2021-02-04 ENCOUNTER — Encounter: Payer: Self-pay | Admitting: Obstetrics and Gynecology

## 2021-02-04 ENCOUNTER — Other Ambulatory Visit: Payer: Self-pay

## 2021-02-04 DIAGNOSIS — Z3480 Encounter for supervision of other normal pregnancy, unspecified trimester: Secondary | ICD-10-CM

## 2021-02-04 DIAGNOSIS — O99820 Streptococcus B carrier state complicating pregnancy: Secondary | ICD-10-CM

## 2021-02-04 NOTE — Progress Notes (Signed)
Pt would like cervix checked today.

## 2021-02-04 NOTE — Patient Instructions (Signed)

## 2021-02-04 NOTE — Progress Notes (Signed)
Subjective:  Priscilla Young is a 30 y.o. G3P2002 at [redacted]w[redacted]d being seen today for ongoing prenatal care.  She is currently monitored for the following issues for this low-risk pregnancy and has Supervision of other normal pregnancy, antepartum; Scoliosis; Alpha thalassemia silent carrier; Ventral hernia without obstruction or gangrene; and Group B Streptococcus carrier, +RV culture, currently pregnant on their problem list.  Patient reports general discomforts of pregnancy.  Contractions: Irregular. Vag. Bleeding: None.  Movement: Present. Denies leaking of fluid.   The following portions of the patient's history were reviewed and updated as appropriate: allergies, current medications, past family history, past medical history, past social history, past surgical history and problem list. Problem list updated.  Objective:   Vitals:   02/04/21 0945  BP: 104/70  Pulse: 77  Weight: 172 lb (78 kg)    Fetal Status: Fetal Heart Rate (bpm): 128   Movement: Present     General:  Alert, oriented and cooperative. Patient is in no acute distress.  Skin: Skin is warm and dry. No rash noted.   Cardiovascular: Normal heart rate noted  Respiratory: Normal respiratory effort, no problems with respiration noted  Abdomen: Soft, gravid, appropriate for gestational age. Pain/Pressure: Present     Pelvic:  Cervical exam performed        Extremities: Normal range of motion.     Mental Status: Normal mood and affect. Normal behavior. Normal judgment and thought content.   Urinalysis:      Assessment and Plan:  Pregnancy: G3P2002 at [redacted]w[redacted]d  1. Group B Streptococcus carrier, +RV culture, currently pregnant Tx while in labor   2. Supervision of other normal pregnancy, antepartum Labor precautions NST next visit   Term labor symptoms and general obstetric precautions including but not limited to vaginal bleeding, contractions, leaking of fluid and fetal movement were reviewed in detail with the  patient. Please refer to After Visit Summary for other counseling recommendations.  Return in about 1 week (around 02/11/2021) for OB visit, face to face with CMW, will need NST .   Hermina Staggers, MD

## 2021-02-07 ENCOUNTER — Encounter (HOSPITAL_COMMUNITY): Payer: Self-pay | Admitting: Obstetrics and Gynecology

## 2021-02-07 ENCOUNTER — Inpatient Hospital Stay (HOSPITAL_COMMUNITY)
Admission: AD | Admit: 2021-02-07 | Discharge: 2021-02-07 | Disposition: A | Discharge status: Home / Self Care | Payer: Medicaid Other | Attending: Obstetrics and Gynecology | Admitting: Obstetrics and Gynecology

## 2021-02-07 ENCOUNTER — Other Ambulatory Visit: Payer: Self-pay

## 2021-02-07 DIAGNOSIS — F17210 Nicotine dependence, cigarettes, uncomplicated: Secondary | ICD-10-CM | POA: Diagnosis not present

## 2021-02-07 DIAGNOSIS — Z0371 Encounter for suspected problem with amniotic cavity and membrane ruled out: Secondary | ICD-10-CM | POA: Diagnosis not present

## 2021-02-07 DIAGNOSIS — O99820 Streptococcus B carrier state complicating pregnancy: Secondary | ICD-10-CM | POA: Insufficient documentation

## 2021-02-07 DIAGNOSIS — Z3A39 39 weeks gestation of pregnancy: Secondary | ICD-10-CM | POA: Diagnosis not present

## 2021-02-07 DIAGNOSIS — O471 False labor at or after 37 completed weeks of gestation: Secondary | ICD-10-CM | POA: Diagnosis not present

## 2021-02-07 DIAGNOSIS — O99333 Smoking (tobacco) complicating pregnancy, third trimester: Secondary | ICD-10-CM | POA: Diagnosis not present

## 2021-02-07 DIAGNOSIS — Z79899 Other long term (current) drug therapy: Secondary | ICD-10-CM | POA: Insufficient documentation

## 2021-02-07 DIAGNOSIS — O26893 Other specified pregnancy related conditions, third trimester: Secondary | ICD-10-CM | POA: Diagnosis not present

## 2021-02-07 DIAGNOSIS — N898 Other specified noninflammatory disorders of vagina: Secondary | ICD-10-CM

## 2021-02-07 DIAGNOSIS — Z3689 Encounter for other specified antenatal screening: Secondary | ICD-10-CM | POA: Insufficient documentation

## 2021-02-07 LAB — POCT FERN TEST: POCT Fern Test: NEGATIVE

## 2021-02-07 NOTE — MAU Note (Signed)
Patient went to use the bathroom around 3:45pm and after urinating states she had another gush of fluid come out.  Unsure if it was her water breaking or not, but wanted to be sure.  Having CTX since last night, now about 5 minutes apart.  No leaking of fluid since initial "gush."  Reports good fetal movement.  Denies vaginal bleeding.

## 2021-02-07 NOTE — MAU Provider Note (Signed)
History   [id number]   Chief Complaint  Patient presents with   Rupture of Membranes    HPI Priscilla Young is a 30 y.o. female  G3P2002 @39 .5 wks here with report of leaking clear fluid around 1545. Leaking of fluid has not continued. Pt reports regular contractions. She denies vaginal bleeding. Last intercourse was not recent. She reports +fetal movement. All other systems negative.    Patient's last menstrual period was 05/28/2020.  OB History  Gravida Para Term Preterm AB Living  3 2 2     2   SAB IAB Ectopic Multiple Live Births          2    # Outcome Date GA Lbr Len/2nd Weight Sex Delivery Anes PTL Lv  3 Current           2 Term 03/28/12 [redacted]w[redacted]d  3884 g M Vag-Spont None  LIV  1 Term 01/07/11 [redacted]w[redacted]d  2977 g M Vag-Spont None  LIV    Obstetric Comments  Induced at 37 weeks with first babye due to oligo     Past Medical History:  Diagnosis Date   Anxiety    Scoliosis     Family History  Problem Relation Age of Onset   Hypertension Mother     Social History   Socioeconomic History   Marital status: Single    Spouse name: Not on file   Number of children: Not on file   Years of education: Not on file   Highest education level: Not on file  Occupational History   Not on file  Tobacco Use   Smoking status: Some Days    Pack years: 0.00   Smokeless tobacco: Never  Vaping Use   Vaping Use: Never used  Substance and Sexual Activity   Alcohol use: Not Currently   Drug use: No   Sexual activity: Yes    Birth control/protection: None  Other Topics Concern   Not on file  Social History Narrative   Not on file   Social Determinants of Health   Financial Resource Strain: Not on file  Food Insecurity: Not on file  Transportation Needs: Not on file  Physical Activity: Not on file  Stress: Not on file  Social Connections: Not on file    No Known Allergies  No current facility-administered medications on file prior to encounter.   Current Outpatient  Medications on File Prior to Encounter  Medication Sig Dispense Refill   ferrous sulfate 325 (65 FE) MG tablet Take 1 tablet (325 mg total) by mouth every other day. (Patient taking differently: Take 325 mg by mouth daily as needed.) 30 tablet 11   ondansetron (ZOFRAN) 4 MG tablet Take 1 tablet (4 mg total) by mouth every 8 (eight) hours as needed for nausea or vomiting. 20 tablet 0   Prenatal Vit-Fe Fumarate-FA (PRENATAL 19) 29-1 MG CHEW Chew 1 tablet by mouth daily. 30 tablet 11   pyridOXINE (VITAMIN B-6) 25 MG tablet Take 1 tablet (25 mg total) by mouth every 8 (eight) hours. 30 tablet 0     Review of Systems  Gastrointestinal:  Positive for abdominal pain (ctx).  Genitourinary:  Positive for vaginal discharge. Negative for vaginal bleeding.    Physical Exam   Vitals:   02/07/21 1602  BP: 130/76  Pulse: 78  Resp: 18  Temp: 99 F (37.2 C)  SpO2: 95%  Weight: 78 kg    Physical Exam Vitals and nursing note reviewed. Exam conducted with a chaperone present.  Constitutional:      General: She is not in acute distress.    Appearance: Normal appearance.  HENT:     Head: Normocephalic and atraumatic.  Cardiovascular:     Rate and Rhythm: Normal rate.  Pulmonary:     Effort: Pulmonary effort is normal. No respiratory distress.  Genitourinary:    Comments: SSE: no pool, fern neg SVE: 2/50/-2, vtx Musculoskeletal:        General: Normal range of motion.     Cervical back: Normal range of motion.  Skin:    General: Skin is warm and dry.  Neurological:     General: No focal deficit present.     Mental Status: She is alert and oriented to person, place, and time.  Psychiatric:        Mood and Affect: Mood normal.        Behavior: Behavior normal.  EFM: 125 bpm, mod variability, + accels, no decels Toco: irreg  Results for orders placed or performed during the hospital encounter of 02/07/21 (from the past 24 hour(s))  POCT fern test     Status: None   Collection Time:  02/07/21  4:41 PM  Result Value Ref Range   POCT Fern Test Negative = intact amniotic membranes     MAU Course  Procedures  MDM Labs ordered and reviewed. No signs of SROM or labor. Stable for discharge home.   Assessment and Plan   1. Group B Streptococcus carrier, +RV culture, currently pregnant   2. [redacted] weeks gestation of pregnancy   3. NST (non-stress test) reactive   4. Vaginal discharge during pregnancy in third trimester    Discharge home Follow up at [facility] as scheduled Labor precautions  Allergies as of 02/07/2021   No Known Allergies      Medication List     TAKE these medications    ferrous sulfate 325 (65 FE) MG tablet Take 1 tablet (325 mg total) by mouth every other day. What changed:  when to take this reasons to take this   ondansetron 4 MG tablet Commonly known as: Zofran Take 1 tablet (4 mg total) by mouth every 8 (eight) hours as needed for nausea or vomiting.   Prenatal 19 29-1 MG Chew Chew 1 tablet by mouth daily.   pyridOXINE 25 MG tablet Commonly known as: VITAMIN B-6 Take 1 tablet (25 mg total) by mouth every 8 (eight) hours.        Donette Larry, CNM 02/07/2021 4:30 PM

## 2021-02-09 ENCOUNTER — Inpatient Hospital Stay (HOSPITAL_COMMUNITY)
Admission: AD | Admit: 2021-02-09 | Discharge: 2021-02-11 | DRG: 807 | Disposition: A | Discharge status: Home / Self Care | Payer: Medicaid Other | Attending: Obstetrics and Gynecology | Admitting: Obstetrics and Gynecology

## 2021-02-09 ENCOUNTER — Other Ambulatory Visit: Payer: Self-pay

## 2021-02-09 ENCOUNTER — Encounter (HOSPITAL_COMMUNITY): Payer: Self-pay | Admitting: Obstetrics and Gynecology

## 2021-02-09 DIAGNOSIS — D563 Thalassemia minor: Secondary | ICD-10-CM | POA: Diagnosis present

## 2021-02-09 DIAGNOSIS — Z30017 Encounter for initial prescription of implantable subdermal contraceptive: Secondary | ICD-10-CM | POA: Diagnosis not present

## 2021-02-09 DIAGNOSIS — O99824 Streptococcus B carrier state complicating childbirth: Secondary | ICD-10-CM | POA: Diagnosis present

## 2021-02-09 DIAGNOSIS — O4292 Full-term premature rupture of membranes, unspecified as to length of time between rupture and onset of labor: Principal | ICD-10-CM | POA: Diagnosis present

## 2021-02-09 DIAGNOSIS — Z3A40 40 weeks gestation of pregnancy: Secondary | ICD-10-CM

## 2021-02-09 DIAGNOSIS — Z20822 Contact with and (suspected) exposure to covid-19: Secondary | ICD-10-CM | POA: Diagnosis present

## 2021-02-09 DIAGNOSIS — O4290 Premature rupture of membranes, unspecified as to length of time between rupture and onset of labor, unspecified weeks of gestation: Secondary | ICD-10-CM | POA: Diagnosis present

## 2021-02-09 DIAGNOSIS — O4202 Full-term premature rupture of membranes, onset of labor within 24 hours of rupture: Secondary | ICD-10-CM | POA: Diagnosis not present

## 2021-02-09 DIAGNOSIS — O99820 Streptococcus B carrier state complicating pregnancy: Secondary | ICD-10-CM

## 2021-02-09 DIAGNOSIS — O26893 Other specified pregnancy related conditions, third trimester: Secondary | ICD-10-CM | POA: Diagnosis present

## 2021-02-09 LAB — RESP PANEL BY RT-PCR (FLU A&B, COVID) ARPGX2
Influenza A by PCR: NEGATIVE
Influenza B by PCR: NEGATIVE
SARS Coronavirus 2 by RT PCR: NEGATIVE

## 2021-02-09 LAB — CBC
HCT: 31.5 % — ABNORMAL LOW (ref 36.0–46.0)
Hemoglobin: 10.1 g/dL — ABNORMAL LOW (ref 12.0–15.0)
MCH: 25.1 pg — ABNORMAL LOW (ref 26.0–34.0)
MCHC: 32.1 g/dL (ref 30.0–36.0)
MCV: 78.4 fL — ABNORMAL LOW (ref 80.0–100.0)
Platelets: 116 10*3/uL — ABNORMAL LOW (ref 150–400)
RBC: 4.02 MIL/uL (ref 3.87–5.11)
RDW: 14.1 % (ref 11.5–15.5)
WBC: 8.3 10*3/uL (ref 4.0–10.5)
nRBC: 0 % (ref 0.0–0.2)

## 2021-02-09 LAB — POCT FERN TEST: POCT Fern Test: POSITIVE

## 2021-02-09 LAB — TYPE AND SCREEN
ABO/RH(D): B POS
Antibody Screen: NEGATIVE

## 2021-02-09 MED ORDER — OXYCODONE-ACETAMINOPHEN 5-325 MG PO TABS
1.0000 | ORAL_TABLET | ORAL | Status: DC | PRN
Start: 2021-02-09 — End: 2021-02-10

## 2021-02-09 MED ORDER — SODIUM CHLORIDE 0.9 % IV SOLN
5.0000 10*6.[IU] | Freq: Once | INTRAVENOUS | Status: AC
  Administered 2021-02-09: 5 10*6.[IU] via INTRAVENOUS
  Filled 2021-02-09: qty 5

## 2021-02-09 MED ORDER — LACTATED RINGERS IV SOLN: INTRAVENOUS | Status: DC

## 2021-02-09 MED ORDER — OXYTOCIN-SODIUM CHLORIDE 30-0.9 UT/500ML-% IV SOLN
2.5000 [IU]/h | INTRAVENOUS | Status: DC
  Filled 2021-02-09: qty 500

## 2021-02-09 MED ORDER — ACETAMINOPHEN 325 MG PO TABS: 650.0000 mg | ORAL_TABLET | ORAL | Status: DC | PRN

## 2021-02-09 MED ORDER — PENICILLIN G POT IN DEXTROSE 60000 UNIT/ML IV SOLN
3.0000 10*6.[IU] | INTRAVENOUS | Status: DC
  Administered 2021-02-10: 3 10*6.[IU] via INTRAVENOUS
  Filled 2021-02-09 (×3): qty 50

## 2021-02-09 MED ORDER — SOD CITRATE-CITRIC ACID 500-334 MG/5ML PO SOLN: 30.0000 mL | ORAL | Status: DC | PRN

## 2021-02-09 MED ORDER — ONDANSETRON HCL 4 MG/2ML IJ SOLN
4.0000 mg | Freq: Four times a day (QID) | INTRAMUSCULAR | Status: DC | PRN
  Administered 2021-02-10: 4 mg via INTRAVENOUS
  Filled 2021-02-09: qty 2

## 2021-02-09 MED ORDER — OXYCODONE-ACETAMINOPHEN 5-325 MG PO TABS: 2.0000 | ORAL_TABLET | ORAL | Status: DC | PRN

## 2021-02-09 MED ORDER — OXYTOCIN BOLUS FROM INFUSION
333.0000 mL | Freq: Once | INTRAVENOUS | Status: AC
  Administered 2021-02-10: 333 mL via INTRAVENOUS

## 2021-02-09 MED ORDER — LACTATED RINGERS IV SOLN
500.0000 mL | INTRAVENOUS | Status: DC | PRN
  Administered 2021-02-10: 1000 mL via INTRAVENOUS
  Administered 2021-02-10: 500 mL via INTRAVENOUS

## 2021-02-09 MED ORDER — LIDOCAINE HCL (PF) 1 % IJ SOLN: 30.0000 mL | INTRAMUSCULAR | Status: DC | PRN

## 2021-02-09 MED ORDER — FENTANYL CITRATE (PF) 100 MCG/2ML IJ SOLN: 100.0000 ug | INTRAMUSCULAR | Status: DC | PRN

## 2021-02-09 MED ORDER — FENTANYL CITRATE (PF) 100 MCG/2ML IJ SOLN: 50.0000 ug | INTRAMUSCULAR | Status: DC | PRN

## 2021-02-09 NOTE — MAU Note (Signed)
Pt stated she started having ctx around 8pm and at 8:30 she had a gush of fluid. Clear. Good fetal movement felt.

## 2021-02-09 NOTE — H&P (Signed)
Priscilla Young is a 30 y.o. G63P2002 female at [redacted]w[redacted]d by 9wk u/s presenting with SROM 2030 followed by reg ctx.   Reports active fetal movement, contractions: regular, every 4-6 minutes, vaginal bleeding: scant staining, membranes: ruptured, clear fluid.  Initiated prenatal care at [facility] at 13 wks.   Most recent u/s: 30.1wk, EFW 58%, AFI 11cm, cephalic, ant placenta.   This pregnancy complicated by: #silent carrier alpha thal #GBS+  Prenatal History/Complications:  SVB x 2 (2012 & 2013)  Past Medical History: Past Medical History:  Diagnosis Date   Anxiety    Scoliosis     Past Surgical History: Past Surgical History:  Procedure Laterality Date   NO PAST SURGERIES      Obstetrical History: OB History     Gravida  3   Para  2   Term  2   Preterm      AB      Living  2      SAB      IAB      Ectopic      Multiple      Live Births  2        Obstetric Comments  Induced at 37 weeks with first babye due to oligo          Social History: Social History   Socioeconomic History   Marital status: Single    Spouse name: Not on file   Number of children: Not on file   Years of education: Not on file   Highest education level: Not on file  Occupational History   Not on file  Tobacco Use   Smoking status: Some Days    Pack years: 0.00   Smokeless tobacco: Never  Vaping Use   Vaping Use: Never used  Substance and Sexual Activity   Alcohol use: Not Currently   Drug use: No   Sexual activity: Yes    Birth control/protection: None  Other Topics Concern   Not on file  Social History Narrative   Not on file   Social Determinants of Health   Financial Resource Strain: Not on file  Food Insecurity: Not on file  Transportation Needs: Not on file  Physical Activity: Not on file  Stress: Not on file  Social Connections: Not on file    Family History: Family History  Problem Relation Age of Onset   Hypertension Mother      Allergies: No Known Allergies  Medications Prior to Admission  Medication Sig Dispense Refill Last Dose   ferrous sulfate 325 (65 FE) MG tablet Take 1 tablet (325 mg total) by mouth every other day. 30 tablet 11    ondansetron (ZOFRAN) 4 MG tablet Take 1 tablet (4 mg total) by mouth every 8 (eight) hours as needed for nausea or vomiting. 20 tablet 0    Prenatal Vit-Fe Fumarate-FA (PRENATAL 19) 29-1 MG CHEW Chew 1 tablet by mouth daily. 30 tablet 11    pyridOXINE (VITAMIN B-6) 25 MG tablet Take 1 tablet (25 mg total) by mouth every 8 (eight) hours. 30 tablet 0     Review of Systems  Pertinent pos/neg as indicated in HPI  Blood pressure 125/82, pulse 91, temperature 98.7 F (37.1 C), resp. rate 18, height 5\' 4"  (1.626 m), weight 78.9 kg, last menstrual period 05/28/2020. General appearance: alert, cooperative, and no distress Lungs: clear to auscultation bilaterally Heart: regular rate and rhythm Abdomen: gravid, soft, non-tender, EFW by Leopold's approximately 7-8lbs Extremities: tr edema DTR's nl  Fetal monitoring: FHR: 120-130s bpm, variability: moderate,  Accelerations: Present,  decelerations:  Absent Uterine activity: irreg 5-8 mins   Presentation: cephalic   Prenatal labs: ABO, Rh: B/Positive/-- (12/07 1140) Antibody: Negative (12/07 1140) Rubella: 1.18 (12/07 1140) RPR: Non Reactive (03/28 1208)  HBsAg: Negative (12/07 1140)  HIV: Non Reactive (03/28 1208)  GBS: Positive/-- (05/16 0926)  2hr GTT: 77/112/108  Prenatal Transfer Tool  Maternal Diabetes: No Genetic Screening: Normal Maternal Ultrasounds/Referrals: Normal Fetal Ultrasounds or other Referrals:  None Maternal Substance Abuse:  No Significant Maternal Medications:  None Significant Maternal Lab Results: Group B Strep positive  Results for orders placed or performed during the hospital encounter of 02/09/21 (from the past 24 hour(s))  Fern Test   Collection Time: 02/09/21  9:39 PM  Result Value  Ref Range   POCT Fern Test Positive = ruptured amniotic membanes      Assessment:  [redacted]w[redacted]d SIUP  G3P2002  SROM  Cat 1 FHR  GBS Positive/-- (05/16 0926)  Plan:  Admit to L&D  IV pain meds/epidural prn active labor  Expectant management; plans waterbirth (consent/class certificate under media tab in chart)  Anticipate vag del   Plans to breastfeed  Contraception: Nexplanon   Arabella Merles CNM 02/09/2021, 9:52 PM

## 2021-02-10 ENCOUNTER — Other Ambulatory Visit: Payer: Medicaid Other

## 2021-02-10 ENCOUNTER — Encounter: Payer: Medicaid Other | Admitting: Advanced Practice Midwife

## 2021-02-10 ENCOUNTER — Encounter (HOSPITAL_COMMUNITY): Payer: Self-pay | Admitting: Obstetrics and Gynecology

## 2021-02-10 DIAGNOSIS — O4202 Full-term premature rupture of membranes, onset of labor within 24 hours of rupture: Secondary | ICD-10-CM

## 2021-02-10 DIAGNOSIS — O99824 Streptococcus B carrier state complicating childbirth: Secondary | ICD-10-CM

## 2021-02-10 DIAGNOSIS — Z3A40 40 weeks gestation of pregnancy: Secondary | ICD-10-CM

## 2021-02-10 LAB — RPR: RPR Ser Ql: NONREACTIVE

## 2021-02-10 MED ORDER — IBUPROFEN 600 MG PO TABS
600.0000 mg | ORAL_TABLET | Freq: Four times a day (QID) | ORAL | Status: DC
  Administered 2021-02-10 – 2021-02-11 (×4): 600 mg via ORAL
  Filled 2021-02-10 (×5): qty 1

## 2021-02-10 MED ORDER — ACETAMINOPHEN 325 MG PO TABS
650.0000 mg | ORAL_TABLET | ORAL | Status: DC | PRN
  Administered 2021-02-10 – 2021-02-11 (×3): 650 mg via ORAL
  Filled 2021-02-10 (×3): qty 2

## 2021-02-10 MED ORDER — SENNOSIDES-DOCUSATE SODIUM 8.6-50 MG PO TABS
2.0000 | ORAL_TABLET | ORAL | Status: DC
  Administered 2021-02-11: 2 via ORAL
  Filled 2021-02-10 (×2): qty 2

## 2021-02-10 MED ORDER — DIBUCAINE (PERIANAL) 1 % EX OINT: 1.0000 "application " | TOPICAL_OINTMENT | CUTANEOUS | Status: DC | PRN

## 2021-02-10 MED ORDER — OXYCODONE HCL 5 MG PO TABS
5.0000 mg | ORAL_TABLET | ORAL | Status: DC | PRN
  Administered 2021-02-10 – 2021-02-11 (×3): 5 mg via ORAL
  Filled 2021-02-10 (×3): qty 1

## 2021-02-10 MED ORDER — ONDANSETRON HCL 4 MG/2ML IJ SOLN: 4.0000 mg | INTRAMUSCULAR | Status: DC | PRN

## 2021-02-10 MED ORDER — BENZOCAINE-MENTHOL 20-0.5 % EX AERO: 1.0000 "application " | INHALATION_SPRAY | CUTANEOUS | Status: DC | PRN

## 2021-02-10 MED ORDER — MISOPROSTOL 200 MCG PO TABS
ORAL_TABLET | ORAL | Status: AC
  Filled 2021-02-10: qty 1

## 2021-02-10 MED ORDER — ONDANSETRON HCL 4 MG PO TABS
4.0000 mg | ORAL_TABLET | ORAL | Status: DC | PRN
  Administered 2021-02-10: 4 mg via ORAL
  Filled 2021-02-10: qty 1

## 2021-02-10 MED ORDER — DIPHENHYDRAMINE HCL 25 MG PO CAPS: 25.0000 mg | ORAL_CAPSULE | Freq: Four times a day (QID) | ORAL | Status: DC | PRN

## 2021-02-10 MED ORDER — WITCH HAZEL-GLYCERIN EX PADS: 1.0000 "application " | MEDICATED_PAD | CUTANEOUS | Status: DC | PRN

## 2021-02-10 MED ORDER — MEASLES, MUMPS & RUBELLA VAC IJ SOLR: 0.5000 mL | Freq: Once | INTRAMUSCULAR | Status: DC

## 2021-02-10 MED ORDER — TETANUS-DIPHTH-ACELL PERTUSSIS 5-2.5-18.5 LF-MCG/0.5 IM SUSY: 0.5000 mL | PREFILLED_SYRINGE | Freq: Once | INTRAMUSCULAR | Status: DC

## 2021-02-10 MED ORDER — MISOPROSTOL 200 MCG PO TABS
ORAL_TABLET | ORAL | Status: AC
  Administered 2021-02-10: 800 ug
  Filled 2021-02-10: qty 4

## 2021-02-10 MED ORDER — COCONUT OIL OIL: 1.0000 "application " | TOPICAL_OIL | Status: DC | PRN

## 2021-02-10 MED ORDER — ZOLPIDEM TARTRATE 5 MG PO TABS: 5.0000 mg | ORAL_TABLET | Freq: Every evening | ORAL | Status: DC | PRN

## 2021-02-10 MED ORDER — MISOPROSTOL 200 MCG PO TABS: 800.0000 ug | ORAL_TABLET | Freq: Once | ORAL | Status: DC

## 2021-02-10 MED ORDER — SIMETHICONE 80 MG PO CHEW: 80.0000 mg | CHEWABLE_TABLET | ORAL | Status: DC | PRN

## 2021-02-10 MED ORDER — PRENATAL MULTIVITAMIN CH
1.0000 | ORAL_TABLET | Freq: Every day | ORAL | Status: DC
  Administered 2021-02-11: 1 via ORAL
  Filled 2021-02-10 (×2): qty 1

## 2021-02-10 NOTE — Lactation Note (Signed)
Lactation Consultation Note  Patient Name: Francyne Arreaga OLMBE'M Date: 02/10/2021 Reason for consult: L&D Initial assessment;Term Age:30 y.o.   LC Initial Consult:  Visited with family < 1 hour after birth. Mother on the phone when I arrived.  Family member at bedside.  Mother has not had an opportunity to remove her nipple rings and preferred to give formula after delivery.  Her feeding preference is to breast/formula feed baby.  Reassured mother that lactation services will be available on the M/B unit and I would be happy to assist her with the next feeding.  Allowed time for family bonding.     Maternal Data     Feeding Mother's Current Feeding Choice: Breast Milk and Formula  LATCH Score                    Lactation Tools Discussed/Used    Interventions    Discharge    Consult Status Consult Status: Follow-up Date: 02/10/21 Follow-up type: In-patient    Dora Sims 02/10/2021, 6:36 AM

## 2021-02-10 NOTE — Progress Notes (Signed)
Patient ID: Priscilla Young, female   DOB: [birth year], 30 y.o.   MRN: [id number]  Not feeling the ctx as much stronger than earlier  VSS, afebrile FHR 120s, +accels, no decels, Cat 1 Ctx 7-9 mins Cx was 4/70/vtx -3 per RN exam @ 0208  IUP@40 .1wk PROM  Will use breast pump to stimulate ctx  Priscilla Young [facility] 02/10/2021 3:07 AM

## 2021-02-10 NOTE — Progress Notes (Signed)
Patient ID: Tanganika Barradas, female   DOB: Apr 19, 1991, 30 y.o.   MRN: [id number]  Feeling ctx stronger; they began spont without the breast pump; PCN x 2 doses  VSS, afebrile FHR 115-120s, +accels, early mild variables some ctx Ctx q 2-3 mins Cx 7/80/vtx -1  IUP@40 .1wk Active labor GBS pos  Filling tub now as she desires to get in Anticipate SVB  Arabella Merles 02/10/2021

## 2021-02-10 NOTE — Plan of Care (Signed)

## 2021-02-10 NOTE — Discharge Summary (Signed)
Postpartum Discharge Summary  Date of Service updated 02/11/21     Patient Name: Priscilla Young DOB: 1990/10/22 MRN: [id number]  Date of admission: 02/09/2021 Delivery date:02/10/2021  Delivering provider: Serita Grammes D  Date of discharge: 02/11/2021  Admitting diagnosis: Amniotic fluid leaking [O42.90] Intrauterine pregnancy: [redacted]w[redacted]d    Secondary diagnosis:  Active Problems:   Alpha thalassemia silent carrier   Group B Streptococcus carrier, +RV culture, currently pregnant   Amniotic fluid leaking   Vaginal delivery  Additional problems: none    Discharge diagnosis: Term Pregnancy Delivered (waterbirth)                                            Post partum procedures: Nexplanon placement Augmentation:  none Complications: None  Hospital course: Onset of Labor With Vaginal Delivery      30y.o. yo G3P2002 at 490w1das admitted in Latent Labor on 02/09/2021. Patient had an uncomplicated labor course, delivering in the tub after PCN x 2 doses and spont labor after SROM.  Membrane Rupture Time/Date: 8:30 PM ,02/09/2021   Delivery Method:Vaginal, Spontaneous  Episiotomy: None  Lacerations:  None  Patient had an uncomplicated postpartum course.  She is ambulating, tolerating a regular diet, passing flatus, and urinating well. Patient is discharged home in stable condition on 02/11/21.  Newborn Data: Birth date:02/10/2021  Birth time:5:32 AM  Gender:Female  Living status:Living  Apgars:9 ,9  Weight:3682 g (8lb 1.9oz)  Magnesium Sulfate received: No BMZ received: No Rhophylac:N/A MMR:N/A T-DaP: offered prenatally Flu: Yes Transfusion:No  Physical exam  Vitals:   02/10/21 1300 02/10/21 1634 02/10/21 2055 02/11/21 0609  BP: 118/72 117/67 117/77 114/73  Pulse: (!) 52 64 (!) 55 (!) 49  Resp: _0 Temp: 98.4 F (36.9 C) 98.3 F (36.8 C) 98.5 F (36.9 C) 98 F (36.7 C)  TempSrc: Oral Oral Oral Oral  SpO2: 100% 100% 100%   Weight:      Height:        General: alert, cooperative, and no distress Lochia: appropriate Uterine Fundus: firm Incision: N/A DVT Evaluation: No evidence of DVT seen on physical exam. Labs: Lab Results  Component Value Date   WBC 8.3 02/09/2021   HGB 10.1 (L) 02/09/2021   HCT 31.5 (L) 02/09/2021   MCV 78.4 (L) 02/09/2021   PLT 116 (L) 02/09/2021   CMP Latest Ref Rng & Units 06/20/2020  Glucose 70 - 99 mg/dL 97  BUN 6 - 20 mg/dL 8  Creatinine 0.44 - 1.00 mg/dL 0.69  Sodium 135 - 145 mmol/L 137  Potassium 3.5 - 5.1 mmol/L 4.4  Chloride 98 - 111 mmol/L 106  CO2 22 - 32 mmol/L 24  Calcium 8.9 - 10.3 mg/dL 8.9  Total Protein 6.5 - 8.1 g/dL 6.5  Total Bilirubin 0.3 - 1.2 mg/dL 0.2(L)  Alkaline Phos 38 - 126 U/L 31(L)  AST 15 - 41 U/L 19  ALT 0 - 44 U/L 28   Edinburgh Score: No flowsheet data found.   After visit meds:  Allergies as of 02/11/2021   No Known Allergies      Medication List     STOP taking these medications    ferrous sulfate 325 (65 FE) MG tablet   ondansetron 4 MG tablet Commonly known as: Zofran   pyridOXINE 25 MG tablet Commonly known as: VITAMIN B-6  TAKE these medications    acetaminophen 500 MG tablet Commonly known as: TYLENOL Take 2 tablets (1,000 mg total) by mouth every 6 (six) hours as needed (for pain scale < 4).   coconut oil Oil Apply 1 application topically as needed.   ibuprofen 600 MG tablet Commonly known as: ADVIL Take 1 tablet (600 mg total) by mouth every 6 (six) hours as needed.   Prenatal 19 29-1 MG Chew Chew 1 tablet by mouth daily.         Discharge home in stable condition Infant Feeding: Breast Infant Disposition:home with mother Discharge instruction: per After Visit Summary and Postpartum booklet. Activity: Advance as tolerated. Pelvic rest for 6 weeks.  Diet: routine diet Future Appointments: Future Appointments  Date Time Provider [place]  03/11/2021  3:00 PM Constant, Vickii Chafe, MD [place] None   Follow  up Visit:  Myrtis Ser, CNM  P [facility] Please schedule this patient for Postpartum visit in: 4 weeks with the following provider: Any provider  In-Person  For C/S patients schedule nurse incision check in weeks 2 weeks: no  Low risk pregnancy complicated by: none  Delivery mode:  SVD  Anticipated Birth Control:  Nexplanon  PP Procedures needed: none  Schedule Integrated [place] visit: no    [id number] Arrie Senate, MD

## 2021-02-10 NOTE — Lactation Note (Signed)
This note was copied from a baby's chart. Lactation Consultation Note  Patient Name: Priscilla Young SWFUX'N Date: 02/10/2021 Reason for consult: Follow-up assessment;Term Age:29 hours   P4 mother whose infant is now 62 hours old.  This is a term baby at 40+1 weeks.  Mother has decided to formula feed only.     Maternal Data    Feeding Mother's Current Feeding Choice: Formula Nipple Type: Slow - flow  LATCH Score                    Lactation Tools Discussed/Used    Interventions    Discharge    Consult Status Consult Status: Complete    Emily Forse R Tavon Corriher 02/10/2021, 9:22 AM

## 2021-02-11 DIAGNOSIS — Z30017 Encounter for initial prescription of implantable subdermal contraceptive: Secondary | ICD-10-CM

## 2021-02-11 MED ORDER — ETONOGESTREL 68 MG ~~LOC~~ IMPL
68.0000 mg | DRUG_IMPLANT | Freq: Once | SUBCUTANEOUS | Status: AC
  Administered 2021-02-11: 68 mg via SUBCUTANEOUS
  Filled 2021-02-11: qty 1

## 2021-02-11 MED ORDER — LIDOCAINE HCL 1 % IJ SOLN
0.0000 mL | Freq: Once | INTRAMUSCULAR | Status: AC | PRN
  Administered 2021-02-11: 20 mL via INTRADERMAL
  Filled 2021-02-11: qty 20

## 2021-02-11 MED ORDER — IBUPROFEN 600 MG PO TABS: 600.0000 mg | ORAL_TABLET | Freq: Four times a day (QID) | ORAL | 0 refills | Status: DC | PRN

## 2021-02-11 MED ORDER — COCONUT OIL OIL: 1.0000 "application " | TOPICAL_OIL | 0 refills | Status: AC | PRN

## 2021-02-11 MED ORDER — ACETAMINOPHEN 500 MG PO TABS: 1000.0000 mg | ORAL_TABLET | Freq: Four times a day (QID) | ORAL | Status: DC | PRN

## 2021-02-11 NOTE — Procedures (Signed)
Nexplanon Insertion:  Patient given informed consent, signed copy in the chart, time out was performed.  Appropriate time out taken.  Patient's Left arm was prepped and draped in the usual sterile fashion.. The ruler used to measure and mark insertion area.  Pt was prepped with alcohol swab and then injected with 5 cc of 2% lidocaine with epinephrine.  Pt was prepped with betadine, Implanon removed form packaging,  Device confirmed in needle, then inserted full length of needle and withdrawn per handbook instructions.  Device palpated by physician and patient.  Pt insertion site covered with pressure dressing.   Minimal blood loss.  Pt tolerated the procedure well.
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
## 2021-02-12 ENCOUNTER — Other Ambulatory Visit: Payer: Self-pay

## 2021-02-12 MED ORDER — IBUPROFEN 600 MG PO TABS: 600.0000 mg | ORAL_TABLET | Freq: Four times a day (QID) | ORAL | 0 refills | Status: AC | PRN

## 2021-02-12 MED ORDER — ACETAMINOPHEN 500 MG PO TABS: 1000.0000 mg | ORAL_TABLET | Freq: Four times a day (QID) | ORAL | 0 refills | Status: AC | PRN

## 2021-03-11 ENCOUNTER — Ambulatory Visit: Payer: Medicaid Other | Admitting: Obstetrics and Gynecology

## 2021-04-10 ENCOUNTER — Ambulatory Visit (INDEPENDENT_AMBULATORY_CARE_PROVIDER_SITE_OTHER): Payer: Medicaid Other

## 2021-04-10 ENCOUNTER — Encounter: Payer: Self-pay | Admitting: Obstetrics

## 2021-04-10 ENCOUNTER — Other Ambulatory Visit: Payer: Self-pay

## 2021-04-10 DIAGNOSIS — Z975 Presence of (intrauterine) contraceptive device: Secondary | ICD-10-CM

## 2021-04-10 DIAGNOSIS — F53 Postpartum depression: Secondary | ICD-10-CM

## 2021-04-10 DIAGNOSIS — O99345 Other mental disorders complicating the puerperium: Secondary | ICD-10-CM | POA: Diagnosis not present

## 2021-04-10 MED ORDER — BUPROPION HCL ER (XL) 150 MG PO TB24: 150.0000 mg | ORAL_TABLET | Freq: Every day | ORAL | 4 refills | Status: AC

## 2021-04-10 NOTE — Progress Notes (Signed)
Post Partum Visit Note  Priscilla Young is a 30 y.o. G39P3003 female who presents for a postpartum visit. She is 8 weeks postpartum following a normal spontaneous vaginal delivery.  I have fully reviewed the prenatal and intrapartum course. The delivery was at 40.1 gestational weeks.  Anesthesia: none. Postpartum course has been uncomplicated. Baby is doing well. Baby is feeding by bottle - Similac Advance. Bleeding moderate lochia. Bowel function is normal. Bladder function is normal. Patient is not sexually active. Contraception method is Nexplanon. Postpartum depression screening: positive.   Patient reports she has no assistance with infant and her other children are with their father since she She states FOB of her most recent child is not involved in childcare. delivered. She does endorse some feelings of depression.  She states she started "crying out of nowhere and just felt emotional."  Patient states this occurred about one week after delivery. She states she has not talked to anyone about her feelings. She denies SI/HI behaviors and endorses safety at home.    The pregnancy intention screening data noted above was reviewed. Potential methods of contraception were discussed. The patient elected to proceed with No data recorded.    Health Maintenance Due  Topic Date Due   COVID-19 Vaccine (1) Never done   Pneumococcal Vaccine 95-37 Years old (1 - PCV) Never done   TETANUS/TDAP  Never done   INFLUENZA VACCINE  03/31/2021    The following portions of the patient's history were reviewed and updated as appropriate: allergies, current medications, past family history, past medical history, past social history, past surgical history, and problem list.  Review of Systems Pertinent items are noted in HPI.  Objective:  There were no vitals taken for this visit.   General:  alert, cooperative, and no distress   Breasts:  not indicated  Lungs: clear to auscultation bilaterally   Heart:  regular rate and rhythm  Abdomen: soft, non-tender; bowel sounds normal; no masses,  no organomegaly   Wound N/A  GU exam:  not indicated       Assessment:    8 weeks postpartum exam.  Postpartum Depression Nexplanon in Place  Plan:   -Patient started on Wellbutrin 150mg  daily. -Informed that this will assist with feelings of depression as well as promote sleep. -Discussed possible side effects of increased depression, SI, or HI thoughts. -Referral sent for [facility]. -Patient to follow up in 2 weeks for depression check/screen. -Given information for [facility]. -Patient requests and given RTW note.   Essential components of care per ACOG recommendations:  1.  Mood and well being: Patient with positive depression screening today. Reviewed local resources for support.  - Patient tobacco use? No.   - hx of drug use? No.    2. Infant care and feeding:  -Patient currently breastmilk feeding? No.  -Social determinants of health (SDOH) reviewed in EPIC. No concern   3. Sexuality, contraception and birth spacing - Patient does not want a pregnancy in the next year.  Desired family size is 3 children.  - Reviewed forms of contraception in tiered fashion. Patient desired  Nexplanon  today.   - Discussed birth spacing of 18 months  4. Sleep and fatigue -Encouraged family/partner/community support of 4 hrs of uninterrupted sleep to help with mood and fatigue  5. Physical Recovery  - Discussed patients delivery and complications. She describes her labor as good. - Patient had a Vaginal, no problems at delivery. Patient had a  no  laceration. Perineal healing reviewed. Patient expressed understanding - Patient has urinary incontinence? No. - Patient is safe to resume physical and sexual activity  6.  Health Maintenance - HM due items addressed No -   - Last pap smear  Diagnosis  Date Value Ref Range Status  08/06/2020      - Negative for intraepithelial  lesion or malignancy (NILM)   Pap smear done at today's visit.  -Breast Cancer screening indicated? No.   7. Chronic Disease/Pregnancy Condition follow up: None - PCP follow up  Cherre Robins, CNM [facility] [employer], [facility]

## 2021-04-10 NOTE — Patient Instructions (Signed)
  [address], [place], [place] [address]; [phone]; [email or web address]

## 2021-04-12 DIAGNOSIS — F53 Postpartum depression: Secondary | ICD-10-CM | POA: Insufficient documentation

## 2021-04-21 ENCOUNTER — Telehealth: Payer: Self-pay | Admitting: Licensed Clinical Social Worker

## 2021-04-21 ENCOUNTER — Encounter: Payer: Medicaid Other | Admitting: Licensed Clinical Social Worker

## 2021-04-21 NOTE — Telephone Encounter (Signed)
Called pt regarding scheduled mychart visit. Left message for callback  

## 2021-04-24 ENCOUNTER — Telehealth: Payer: Medicaid Other | Admitting: Obstetrics and Gynecology

## 2021-04-24 DIAGNOSIS — F53 Postpartum depression: Secondary | ICD-10-CM

## 2021-04-24 DIAGNOSIS — Z5329 Procedure and treatment not carried out because of patient's decision for other reasons: Secondary | ICD-10-CM

## 2021-04-24 DIAGNOSIS — Z91199 Patient's noncompliance with other medical treatment and regimen due to unspecified reason: Secondary | ICD-10-CM | POA: Insufficient documentation

## 2021-04-24 NOTE — Progress Notes (Signed)
Pt never was never able to sign on for virtual visit
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
## 2022-05-16 IMAGING — US US OB COMP LESS 14 WK
1 series · 15 of 28 positions shown · non-contrast
Comparison: None.

CLINICAL DATA: Abdominal pain and vaginal spotting.  Unknown LMP.

EXAM:
OBSTETRIC <14 WK ULTRASOUND
TECHNIQUE: Transabdominal ultrasound was performed for evaluation of the
gestation as well as the maternal uterus and adnexal regions.

[Series 1: us ob comp less 14 wk · 15 of 37 slices shown]
[im 1/37]
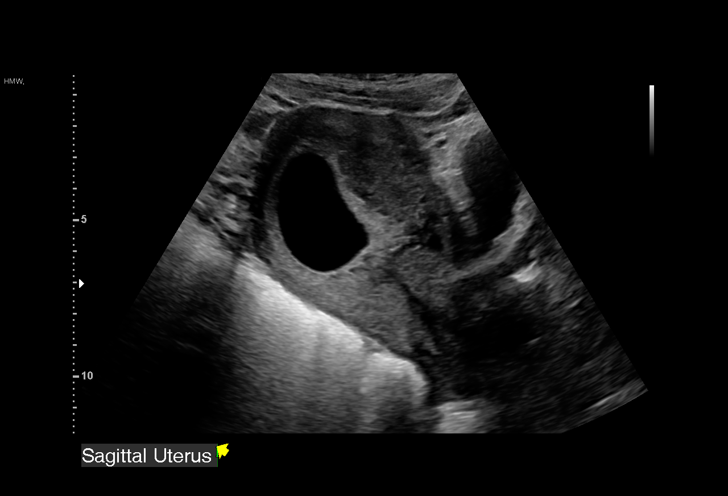
[im 3/37]
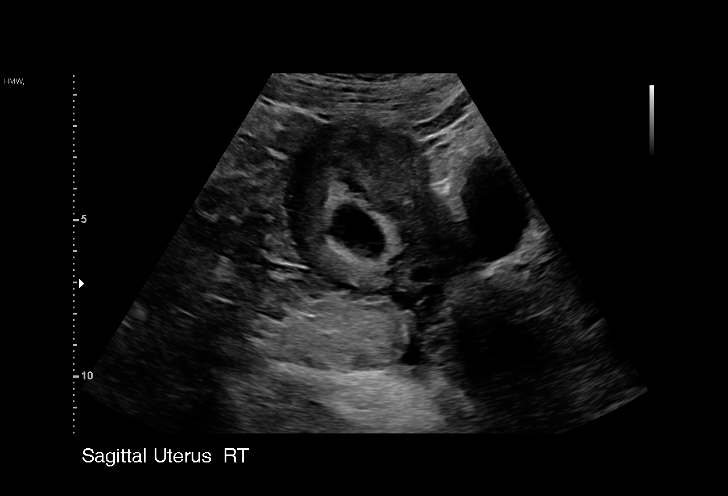
[im 6/37]
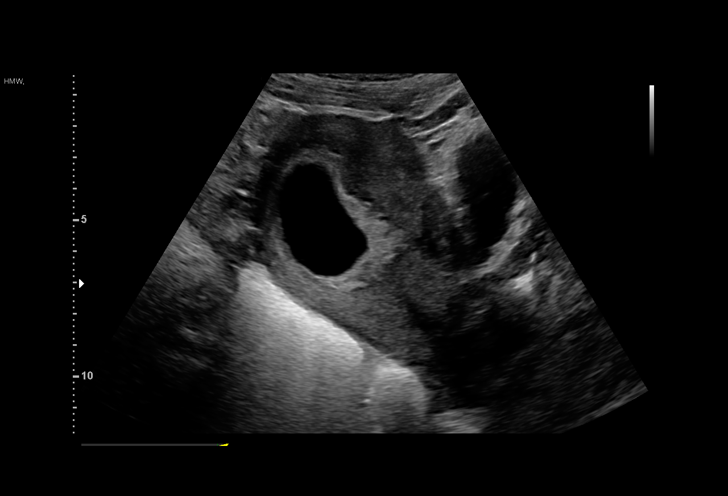
[im 9/37]
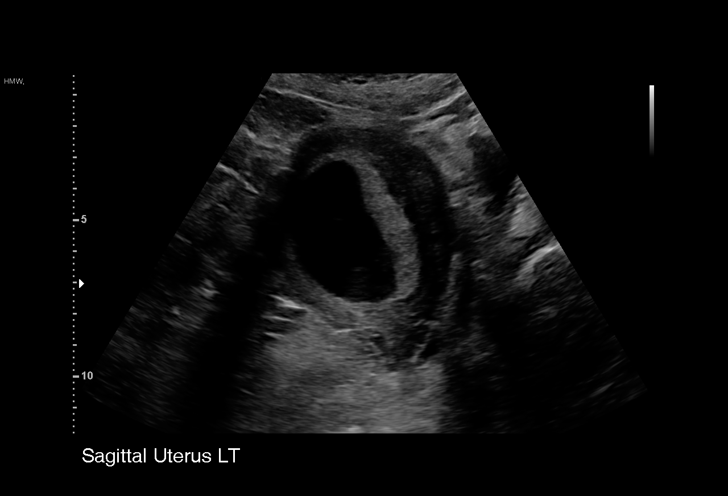
[im 11/37]
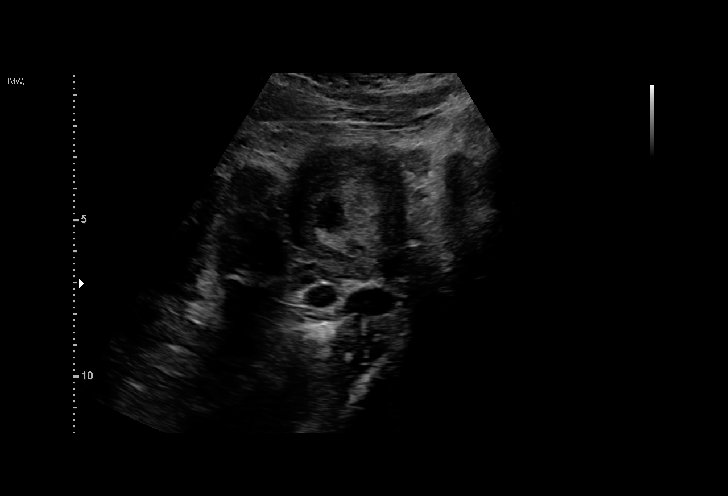
[im 14/37]
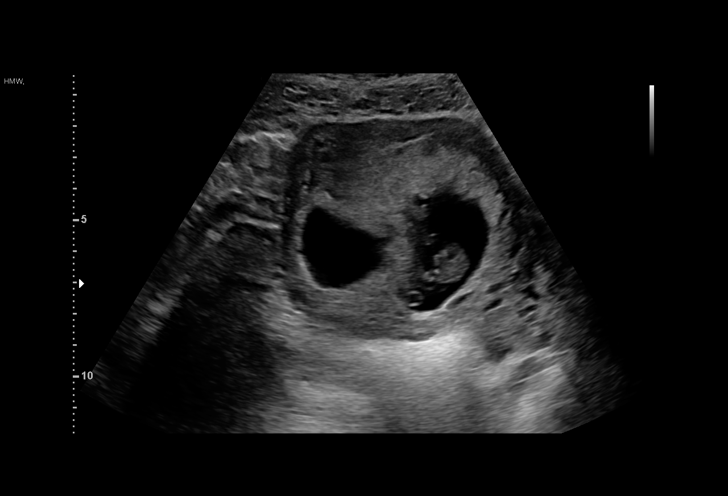
[im 17/37]
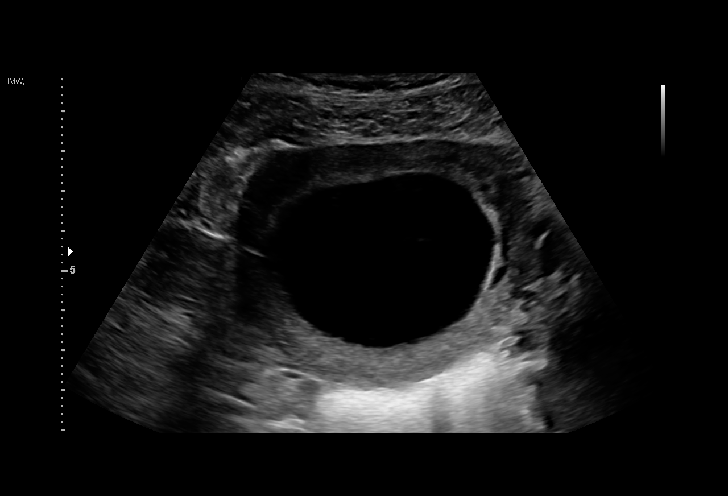
[im 19/37]
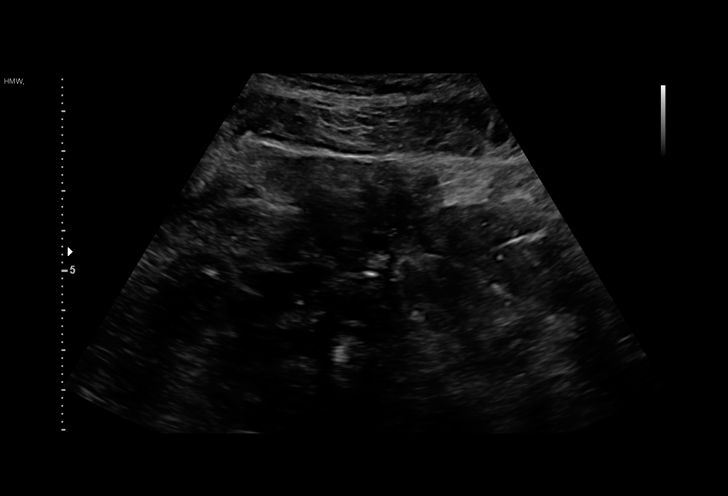
[im 21/37]
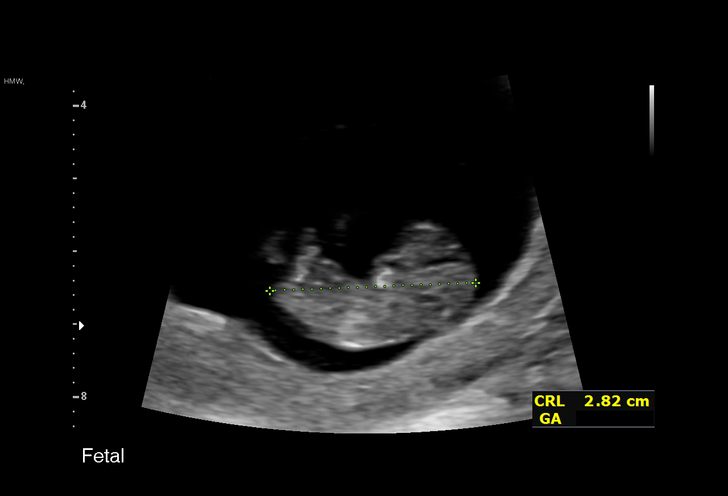
[im 23/37]
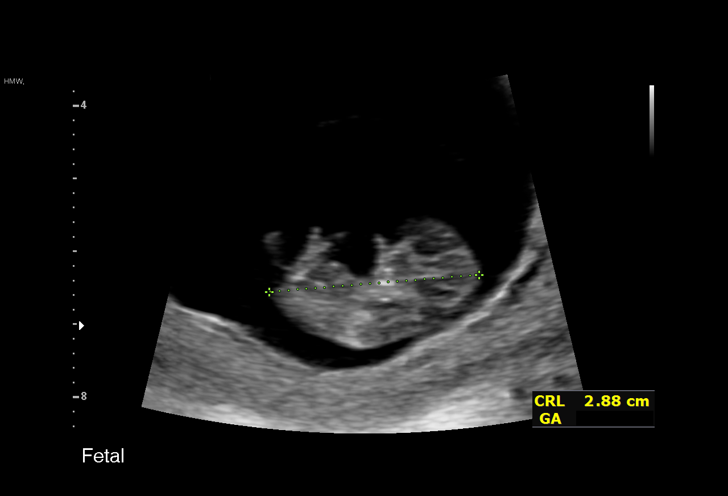
[im 26/37]
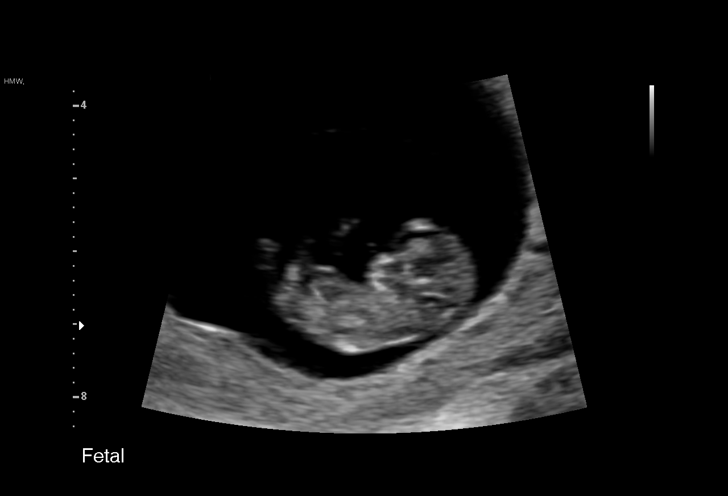
[im 29/37]
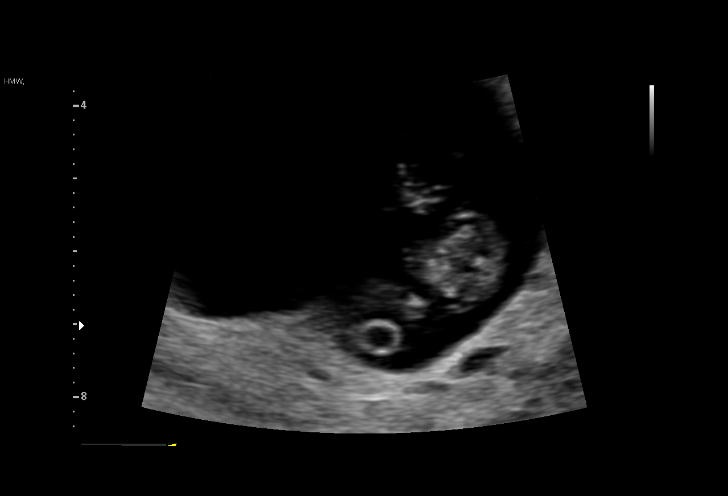
[im 31/37]
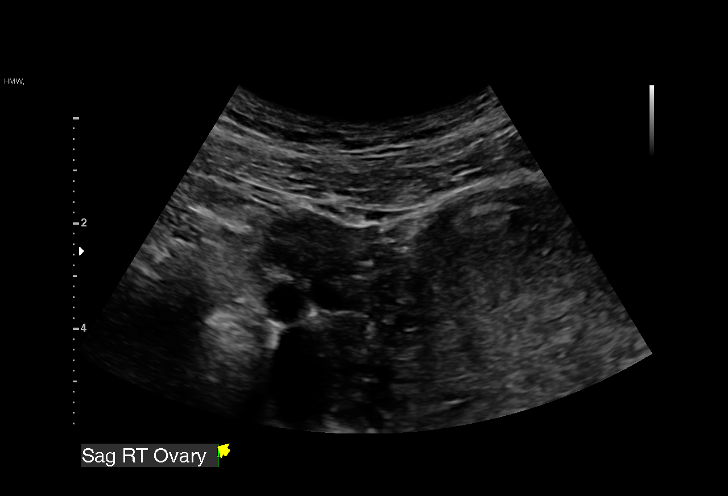
[im 34/37]
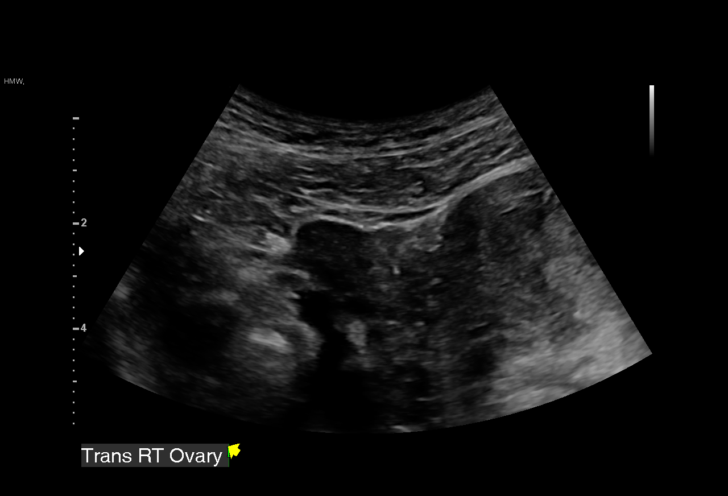
[im 37/37]
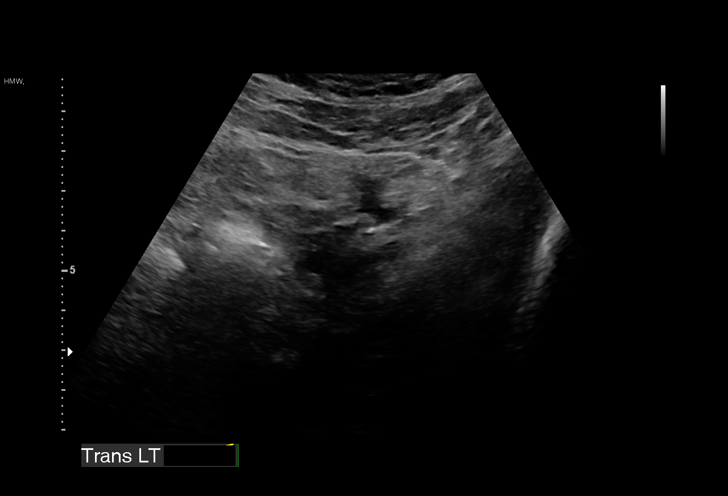

[15 of 28 positions shown; findings below may reference images not displayed]

FINDINGS: Intrauterine gestational sac: Single

Yolk sac:  Visualized.

Embryo:  Visualized.

Cardiac Activity: Visualized.

Heart Rate: 155 bpm

CRL:   28 mm   9 w 4 d                  US EDC: 02/09/2021

Subchorionic hemorrhage:  None visualized.

Maternal uterus/adnexae: The right ovary is normal in appearance.
Left ovary is not directly visualized, however no adnexal mass or
abnormal free fluid identified.
IMPRESSION: Single living IUP with estimated gestational age of 9 weeks 4 days,
and US EDC of 02/09/2021.

No maternal uterine or adnexal abnormality identified.

## 2023-04-05 ENCOUNTER — Ambulatory Visit: Payer: Medicaid Other | Admitting: Podiatry
